# Patient Record
Sex: Female | Born: 1985
Health system: Southern US, Community
[De-identification: ages and names within clinical notes are randomized; demographics above are authoritative.]

## PROBLEM LIST (undated history)

## (undated) ENCOUNTER — Inpatient Hospital Stay (HOSPITAL_COMMUNITY): Payer: Self-pay

## (undated) DIAGNOSIS — R51 Headache: Secondary | ICD-10-CM

## (undated) DIAGNOSIS — R519 Headache, unspecified: Secondary | ICD-10-CM

## (undated) DIAGNOSIS — I10 Essential (primary) hypertension: Secondary | ICD-10-CM

## (undated) DIAGNOSIS — O24419 Gestational diabetes mellitus in pregnancy, unspecified control: Secondary | ICD-10-CM

## (undated) DIAGNOSIS — R87629 Unspecified abnormal cytological findings in specimens from vagina: Secondary | ICD-10-CM

## (undated) HISTORY — PX: WISDOM TOOTH EXTRACTION: SHX21

---
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
## 2007-02-07 ENCOUNTER — Other Ambulatory Visit: Admission: RE | Admit: 2007-02-07 | Discharge: 2007-02-07 | Payer: Self-pay | Admitting: Family Medicine

## 2007-04-11 ENCOUNTER — Ambulatory Visit: Payer: Self-pay | Admitting: Pediatrics

## 2007-07-26 ENCOUNTER — Ambulatory Visit: Payer: Self-pay | Admitting: *Deleted

## 2007-11-28 ENCOUNTER — Ambulatory Visit: Payer: Self-pay | Admitting: Pediatrics

## 2008-02-19 ENCOUNTER — Ambulatory Visit: Payer: Self-pay | Admitting: Pediatrics

## 2008-05-17 ENCOUNTER — Ambulatory Visit: Payer: Self-pay | Admitting: Pediatrics

## 2008-07-15 ENCOUNTER — Other Ambulatory Visit: Admission: RE | Admit: 2008-07-15 | Discharge: 2008-07-15 | Payer: Self-pay | Admitting: Family Medicine

## 2008-09-13 ENCOUNTER — Ambulatory Visit: Payer: Self-pay | Admitting: Pediatrics

## 2008-12-11 ENCOUNTER — Ambulatory Visit: Payer: Self-pay | Admitting: Pediatrics

## 2009-03-20 ENCOUNTER — Ambulatory Visit: Payer: Self-pay | Admitting: Pediatrics

## 2009-07-30 ENCOUNTER — Ambulatory Visit: Payer: Self-pay | Admitting: Pediatrics

## 2009-08-06 ENCOUNTER — Other Ambulatory Visit: Admission: RE | Admit: 2009-08-06 | Discharge: 2009-08-06 | Payer: Self-pay | Admitting: Family Medicine

## 2009-10-28 ENCOUNTER — Ambulatory Visit: Payer: Self-pay | Admitting: Pediatrics

## 2010-02-03 ENCOUNTER — Ambulatory Visit: Payer: Self-pay | Admitting: Pediatrics

## 2010-05-13 ENCOUNTER — Institutional Professional Consult (permissible substitution) (INDEPENDENT_AMBULATORY_CARE_PROVIDER_SITE_OTHER): Payer: 59 | Admitting: Family

## 2010-05-13 DIAGNOSIS — F909 Attention-deficit hyperactivity disorder, unspecified type: Secondary | ICD-10-CM

## 2010-08-17 ENCOUNTER — Institutional Professional Consult (permissible substitution) (INDEPENDENT_AMBULATORY_CARE_PROVIDER_SITE_OTHER): Payer: 59 | Admitting: Family

## 2010-08-17 DIAGNOSIS — R279 Unspecified lack of coordination: Secondary | ICD-10-CM

## 2010-08-17 DIAGNOSIS — F909 Attention-deficit hyperactivity disorder, unspecified type: Secondary | ICD-10-CM

## 2010-09-14 ENCOUNTER — Other Ambulatory Visit (HOSPITAL_COMMUNITY)
Admission: RE | Admit: 2010-09-14 | Discharge: 2010-09-14 | Disposition: A | Discharge status: Home / Self Care | Payer: 59 | Source: Ambulatory Visit | Attending: Family Medicine | Admitting: Family Medicine

## 2010-09-14 ENCOUNTER — Other Ambulatory Visit: Payer: Self-pay | Admitting: Family Medicine

## 2010-09-14 DIAGNOSIS — R87810 Cervical high risk human papillomavirus (HPV) DNA test positive: Secondary | ICD-10-CM | POA: Insufficient documentation

## 2010-09-14 DIAGNOSIS — Z124 Encounter for screening for malignant neoplasm of cervix: Secondary | ICD-10-CM | POA: Insufficient documentation

## 2010-11-05 ENCOUNTER — Other Ambulatory Visit: Payer: Self-pay | Admitting: Obstetrics and Gynecology

## 2010-11-16 ENCOUNTER — Institutional Professional Consult (permissible substitution) (INDEPENDENT_AMBULATORY_CARE_PROVIDER_SITE_OTHER): Payer: 59 | Admitting: Family

## 2010-11-16 DIAGNOSIS — F909 Attention-deficit hyperactivity disorder, unspecified type: Secondary | ICD-10-CM

## 2011-02-01 ENCOUNTER — Institutional Professional Consult (permissible substitution) (INDEPENDENT_AMBULATORY_CARE_PROVIDER_SITE_OTHER): Payer: 59 | Admitting: Pediatrics

## 2011-02-01 DIAGNOSIS — R279 Unspecified lack of coordination: Secondary | ICD-10-CM

## 2011-02-01 DIAGNOSIS — F909 Attention-deficit hyperactivity disorder, unspecified type: Secondary | ICD-10-CM

## 2011-04-29 ENCOUNTER — Institutional Professional Consult (permissible substitution): Payer: 59 | Admitting: Family

## 2011-05-03 ENCOUNTER — Institutional Professional Consult (permissible substitution) (INDEPENDENT_AMBULATORY_CARE_PROVIDER_SITE_OTHER): Payer: 59 | Admitting: Family

## 2011-05-03 DIAGNOSIS — R279 Unspecified lack of coordination: Secondary | ICD-10-CM

## 2011-05-03 DIAGNOSIS — F909 Attention-deficit hyperactivity disorder, unspecified type: Secondary | ICD-10-CM

## 2011-05-05 ENCOUNTER — Other Ambulatory Visit: Payer: Self-pay | Admitting: Obstetrics and Gynecology

## 2011-05-05 ENCOUNTER — Other Ambulatory Visit (HOSPITAL_COMMUNITY)
Admission: RE | Admit: 2011-05-05 | Discharge: 2011-05-05 | Disposition: A | Discharge status: Home / Self Care | Payer: 59 | Source: Ambulatory Visit | Attending: Obstetrics and Gynecology | Admitting: Obstetrics and Gynecology

## 2011-05-05 DIAGNOSIS — R87810 Cervical high risk human papillomavirus (HPV) DNA test positive: Secondary | ICD-10-CM | POA: Insufficient documentation

## 2011-05-05 DIAGNOSIS — Z01419 Encounter for gynecological examination (general) (routine) without abnormal findings: Secondary | ICD-10-CM | POA: Insufficient documentation

## 2011-07-29 ENCOUNTER — Institutional Professional Consult (permissible substitution) (INDEPENDENT_AMBULATORY_CARE_PROVIDER_SITE_OTHER): Payer: 59 | Admitting: Family

## 2011-07-29 DIAGNOSIS — F909 Attention-deficit hyperactivity disorder, unspecified type: Secondary | ICD-10-CM

## 2011-07-29 DIAGNOSIS — R279 Unspecified lack of coordination: Secondary | ICD-10-CM

## 2011-11-03 ENCOUNTER — Other Ambulatory Visit (HOSPITAL_COMMUNITY)
Admission: RE | Admit: 2011-11-03 | Discharge: 2011-11-03 | Disposition: A | Discharge status: Home / Self Care | Payer: 59 | Source: Ambulatory Visit | Attending: Obstetrics and Gynecology | Admitting: Obstetrics and Gynecology

## 2011-11-03 ENCOUNTER — Other Ambulatory Visit: Payer: Self-pay | Admitting: Obstetrics and Gynecology

## 2011-11-03 DIAGNOSIS — Z113 Encounter for screening for infections with a predominantly sexual mode of transmission: Secondary | ICD-10-CM | POA: Insufficient documentation

## 2011-11-03 DIAGNOSIS — Z01419 Encounter for gynecological examination (general) (routine) without abnormal findings: Secondary | ICD-10-CM | POA: Insufficient documentation

## 2011-11-03 DIAGNOSIS — Z1151 Encounter for screening for human papillomavirus (HPV): Secondary | ICD-10-CM | POA: Insufficient documentation

## 2011-11-03 DIAGNOSIS — R87810 Cervical high risk human papillomavirus (HPV) DNA test positive: Secondary | ICD-10-CM | POA: Insufficient documentation

## 2011-11-04 ENCOUNTER — Institutional Professional Consult (permissible substitution): Payer: 59 | Admitting: Family

## 2011-11-04 DIAGNOSIS — F909 Attention-deficit hyperactivity disorder, unspecified type: Secondary | ICD-10-CM

## 2012-02-01 ENCOUNTER — Institutional Professional Consult (permissible substitution): Payer: 59 | Admitting: Family

## 2012-02-01 DIAGNOSIS — F909 Attention-deficit hyperactivity disorder, unspecified type: Secondary | ICD-10-CM

## 2012-05-01 ENCOUNTER — Institutional Professional Consult (permissible substitution) (INDEPENDENT_AMBULATORY_CARE_PROVIDER_SITE_OTHER): Payer: 59 | Admitting: Family

## 2012-05-01 DIAGNOSIS — F909 Attention-deficit hyperactivity disorder, unspecified type: Secondary | ICD-10-CM

## 2012-08-01 ENCOUNTER — Institutional Professional Consult (permissible substitution) (INDEPENDENT_AMBULATORY_CARE_PROVIDER_SITE_OTHER): Payer: 59 | Admitting: Family

## 2012-08-01 DIAGNOSIS — F909 Attention-deficit hyperactivity disorder, unspecified type: Secondary | ICD-10-CM

## 2012-08-01 DIAGNOSIS — R279 Unspecified lack of coordination: Secondary | ICD-10-CM

## 2012-10-30 ENCOUNTER — Institutional Professional Consult (permissible substitution) (INDEPENDENT_AMBULATORY_CARE_PROVIDER_SITE_OTHER): Payer: 59 | Admitting: Family

## 2012-10-30 DIAGNOSIS — F909 Attention-deficit hyperactivity disorder, unspecified type: Secondary | ICD-10-CM

## 2012-10-30 DIAGNOSIS — R279 Unspecified lack of coordination: Secondary | ICD-10-CM

## 2012-11-06 ENCOUNTER — Other Ambulatory Visit: Payer: Self-pay | Admitting: Obstetrics and Gynecology

## 2012-11-06 ENCOUNTER — Other Ambulatory Visit (HOSPITAL_COMMUNITY)
Admission: RE | Admit: 2012-11-06 | Discharge: 2012-11-06 | Disposition: A | Discharge status: Home / Self Care | Payer: 59 | Source: Ambulatory Visit | Attending: Obstetrics and Gynecology | Admitting: Obstetrics and Gynecology

## 2012-11-06 DIAGNOSIS — Z01419 Encounter for gynecological examination (general) (routine) without abnormal findings: Secondary | ICD-10-CM | POA: Insufficient documentation

## 2013-01-26 ENCOUNTER — Institutional Professional Consult (permissible substitution) (INDEPENDENT_AMBULATORY_CARE_PROVIDER_SITE_OTHER): Payer: 59 | Admitting: Family

## 2013-01-26 DIAGNOSIS — F909 Attention-deficit hyperactivity disorder, unspecified type: Secondary | ICD-10-CM

## 2013-01-26 DIAGNOSIS — R279 Unspecified lack of coordination: Secondary | ICD-10-CM

## 2013-03-03 ENCOUNTER — Emergency Department (HOSPITAL_COMMUNITY)
Admission: EM | Admit: 2013-03-03 | Discharge: 2013-03-03 | Disposition: A | Discharge status: Home / Self Care | Payer: 59 | Source: Home / Self Care | Attending: Family Medicine | Admitting: Family Medicine

## 2013-03-03 ENCOUNTER — Encounter (HOSPITAL_COMMUNITY): Payer: Self-pay | Admitting: Emergency Medicine

## 2013-03-03 DIAGNOSIS — J00 Acute nasopharyngitis [common cold]: Secondary | ICD-10-CM

## 2013-03-03 MED ORDER — AZITHROMYCIN 250 MG PO TABS: ORAL_TABLET | ORAL | Status: DC

## 2013-03-03 MED ORDER — HYDROCOD POLST-CHLORPHEN POLST 10-8 MG/5ML PO LQCR: 5.0000 mL | Freq: Two times a day (BID) | ORAL | Status: DC | PRN

## 2013-03-03 MED ORDER — IPRATROPIUM BROMIDE 0.06 % NA SOLN: 2.0000 | Freq: Four times a day (QID) | NASAL | Status: DC

## 2013-03-03 NOTE — ED Notes (Signed)
C/o cough , sore throat on Wednesday, chest congestion some drainage

## 2013-03-03 NOTE — Discharge Instructions (Signed)
Take the antibiotic only if you are not getting better by the end of the weekend, or if you are starting to feel significantly sicker in the next few days.    Upper Respiratory Infection, Adult An upper respiratory infection (URI) is also sometimes known as the common cold. The upper respiratory tract includes the nose, sinuses, throat, trachea, and bronchi. Bronchi are the airways leading to the lungs. Most people improve within 1 week, but symptoms can last up to 2 weeks. A residual cough may last even longer.  CAUSES Many different viruses can infect the tissues lining the upper respiratory tract. The tissues become irritated and inflamed and often become very moist. Mucus production is also common. A cold is contagious. You can easily spread the virus to others by oral contact. This includes kissing, sharing a glass, coughing, or sneezing. Touching your mouth or nose and then touching a surface, which is then touched by another person, can also spread the virus. SYMPTOMS  Symptoms typically develop 1 to 3 days after you come in contact with a cold virus. Symptoms vary from person to person. They may include:  Runny nose.  Sneezing.  Nasal congestion.  Sinus irritation.  Sore throat.  Loss of voice (laryngitis).  Cough.  Fatigue.  Muscle aches.  Loss of appetite.  Headache.  Low-grade fever. DIAGNOSIS  You might diagnose your own cold based on familiar symptoms, since most people get a cold 2 to 3 times a year. Your caregiver can confirm this based on your exam. Most importantly, your caregiver can check that your symptoms are not due to another disease such as strep throat, sinusitis, pneumonia, asthma, or epiglottitis. Blood tests, throat tests, and X-rays are not necessary to diagnose a common cold, but they may sometimes be helpful in excluding other more serious diseases. Your caregiver will decide if any further tests are required. RISKS AND COMPLICATIONS  You may be at  risk for a more severe case of the common cold if you smoke cigarettes, have chronic heart disease (such as heart failure) or lung disease (such as asthma), or if you have a weakened immune system. The very young and very old are also at risk for more serious infections. Bacterial sinusitis, middle ear infections, and bacterial pneumonia can complicate the common cold. The common cold can worsen asthma and chronic obstructive pulmonary disease (COPD). Sometimes, these complications can require emergency medical care and may be life-threatening. PREVENTION  The best way to protect against getting a cold is to practice good hygiene. Avoid oral or hand contact with people with cold symptoms. Wash your hands often if contact occurs. There is no clear evidence that vitamin C, vitamin E, echinacea, or exercise reduces the chance of developing a cold. However, it is always recommended to get plenty of rest and practice good nutrition. TREATMENT  Treatment is directed at relieving symptoms. There is no cure. Antibiotics are not effective, because the infection is caused by a virus, not by bacteria. Treatment may include:  Increased fluid intake. Sports drinks offer valuable electrolytes, sugars, and fluids.  Breathing heated mist or steam (vaporizer or shower).  Eating chicken soup or other clear broths, and maintaining good nutrition.  Getting plenty of rest.  Using gargles or lozenges for comfort.  Controlling fevers with ibuprofen or acetaminophen as directed by your caregiver.  Increasing usage of your inhaler if you have asthma. Zinc gel and zinc lozenges, taken in the first 24 hours of the common cold, can shorten the  duration and lessen the severity of symptoms. Pain medicines may help with fever, muscle aches, and throat pain. A variety of non-prescription medicines are available to treat congestion and runny nose. Your caregiver can make recommendations and may suggest nasal or lung inhalers for  other symptoms.  HOME CARE INSTRUCTIONS   Only take over-the-counter or prescription medicines for pain, discomfort, or fever as directed by your caregiver.  Use a warm mist humidifier or inhale steam from a shower to increase air moisture. This may keep secretions moist and make it easier to breathe.  Drink enough water and fluids to keep your urine clear or pale yellow.  Rest as needed.  Return to work when your temperature has returned to normal or as your caregiver advises. You may need to stay home longer to avoid infecting others. You can also use a face mask and careful hand washing to prevent spread of the virus. SEEK MEDICAL CARE IF:   After the first few days, you feel you are getting worse rather than better.  You need your caregiver's advice about medicines to control symptoms.  You develop chills, worsening shortness of breath, or brown or red sputum. These may be signs of pneumonia.  You develop yellow or brown nasal discharge or pain in the face, especially when you bend forward. These may be signs of sinusitis.  You develop a fever, swollen neck glands, pain with swallowing, or white areas in the back of your throat. These may be signs of strep throat. SEEK IMMEDIATE MEDICAL CARE IF:   You have a fever.  You develop severe or persistent headache, ear pain, sinus pain, or chest pain.  You develop wheezing, a prolonged cough, cough up blood, or have a change in your usual mucus (if you have chronic lung disease).  You develop sore muscles or a stiff neck. Document Released: 08/04/2000 Document Revised: 05/03/2011 Document Reviewed: 06/12/2010 [facility] Patient Information 2014 [place], [place].

## 2013-03-03 NOTE — ED Provider Notes (Signed)
CSN: [id number]     Arrival date & time 03/03/13  4098 History   First MD Initiated Contact with Patient 03/03/13 0932     Chief Complaint  Patient presents with  . URI   (Consider location/radiation/quality/duration/timing/severity/associated sxs/prior Treatment) HPI Comments: 28 year old female who takes methotrexate for psoriasis presents complaining of a cold for 3 days. She has Sore throat, cough, subjective fevers that started on Wednesday 3 days ago, not getting better.  She did take dose of methotrexate after this started although she states that she knows to not take it if she is sick.  She says that overall her symptoms are moderate. She is taking over-the-counter medications with some relief of her symptoms  Patient is a 28 y.o. female presenting with URI.  URI Presenting symptoms: congestion, cough and ear pain (mild, resolving)   Presenting symptoms: no fever   Associated symptoms: no arthralgias and no myalgias     History reviewed. No pertinent past medical history. History reviewed. No pertinent past surgical history. No family history on file. History  Substance Use Topics  . Smoking status: Never Smoker   . Smokeless tobacco: Never Used  . Alcohol Use: No   OB History   Grav Para Term Preterm Abortions TAB SAB Ect Mult Living                 Review of Systems  Constitutional: Positive for chills. Negative for fever.  HENT: Positive for congestion, ear pain (mild, resolving) and sinus pressure (resolved).   Eyes: Negative for visual disturbance.  Respiratory: Positive for cough. Negative for chest tightness and shortness of breath.   Cardiovascular: Negative for chest pain, palpitations and leg swelling.  Gastrointestinal: Negative for nausea, vomiting, abdominal pain and diarrhea.  Endocrine: Negative for polydipsia and polyuria.  Genitourinary: Negative for dysuria, urgency and frequency.  Musculoskeletal: Negative for arthralgias and myalgias.  Skin:  Negative for rash.  Neurological: Negative for dizziness, weakness and light-headedness.    Allergies  Amoxicillin and Penicillins  Home Medications   Current Outpatient Rx  Name  Route  Sig  Dispense  Refill  . FOLIC ACID PO   Oral   Take by mouth daily.         . Lisdexamfetamine Dimesylate (VYVANSE PO)   Oral   Take 100 mg by mouth daily.         . methotrexate (RHEUMATREX) 2.5 MG tablet   Oral   Take 2.5 mg by mouth as directed. Caution:Chemotherapy. Protect from light.         . norethindrone-ethinyl estradiol-iron (ESTROSTEP FE,TILIA FE,TRI-LEGEST FE) 1-20/1-30/1-35 MG-MCG tablet   Oral   Take 1 tablet by mouth daily.         Marland Kitchen azithromycin (ZITHROMAX Z-PAK) 250 MG tablet      Use as directed   6 each   0   . chlorpheniramine-HYDROcodone (TUSSIONEX PENNKINETIC ER) 10-8 MG/5ML LQCR   Oral   Take 5 mLs by mouth every 12 (twelve) hours as needed for cough.   115 mL   0   . ipratropium (ATROVENT) 0.06 % nasal spray   Each Nare   Place 2 sprays into both nostrils 4 (four) times daily.   15 mL   12    BP 104/84  Pulse 115  Temp(Src) 98.5 F (36.9 C) (Oral)  Resp 18  SpO2 100%  LMP 03/03/2013 Physical Exam  Nursing note and vitals reviewed. Constitutional: She is oriented to person, place, and time. Vital signs are  normal. She appears well-developed and well-nourished. No distress.  HENT:  Head: Normocephalic and atraumatic.  Right Ear: External ear normal.  Left Ear: External ear normal.  Nose: Nose normal.  Mouth/Throat: Posterior oropharyngeal erythema (mild) present. No oropharyngeal exudate.  Neck: Normal range of motion. Neck supple.  Cardiovascular: Regular rhythm and normal heart sounds.  Tachycardia present.  Exam reveals no gallop and no friction rub.   No murmur heard. Pulmonary/Chest: Effort normal and breath sounds normal. No respiratory distress. She has no wheezes. She has no rales.  Lymphadenopathy:    She has no cervical  adenopathy.  Neurological: She is alert and oriented to person, place, and time. She has normal strength. Coordination normal.  Skin: Skin is warm and dry. No rash noted. She is not diaphoretic.  Psychiatric: She has a normal mood and affect. Judgment normal.    ED Course  Procedures (including critical care time) Labs Review Labs Reviewed - No data to display Imaging Review No results found.    MDM   1. Acute nasopharyngitis (common cold)     Tachycardia most likely sencondary to vyvanse.  Common cold, post-dated Rx for z pack to start if any worsening bc she is on methotrexate.  Hold methotrexate until symptoms and illness resolves.  symptomatic management for now.  F/u PRN     Meds ordered this encounter  Medications  . azithromycin (ZITHROMAX Z-PAK) 250 MG tablet    Sig: Use as directed    Dispense:  6 each    Refill:  0    Order Specific Question:  Supervising Provider    Answer:  Linna HoffKINDL, JAMES D [phone][[id number]]  . ipratropium (ATROVENT) 0.06 % nasal spray    Sig: Place 2 sprays into both nostrils 4 (four) times daily.    Dispense:  15 mL    Refill:  12    Order Specific Question:  Supervising Provider    Answer:  Linna HoffKINDL, JAMES D [phone][[id number]]  . chlorpheniramine-HYDROcodone (TUSSIONEX PENNKINETIC ER) 10-8 MG/5ML LQCR    Sig: Take 5 mLs by mouth every 12 (twelve) hours as needed for cough.    Dispense:  115 mL    Refill:  0    Order Specific Question:  Supervising Provider    Answer:  Bradd CanaryKINDL, JAMES D [[id number]]     Graylon GoodZachary H Chiyo Fay, PA-C 03/04/13 [phone]

## 2013-03-05 NOTE — ED Provider Notes (Signed)
Medical screening examination/treatment/procedure(s) were performed by a resident physician or non-physician practitioner and as the supervising physician I was immediately available for consultation/collaboration.  Cloey Sferrazza, MD    Anwitha Mapes S Hodan Wurtz, MD 03/05/13 0747 

## 2013-04-23 ENCOUNTER — Institutional Professional Consult (permissible substitution) (INDEPENDENT_AMBULATORY_CARE_PROVIDER_SITE_OTHER): Payer: 59 | Admitting: Family

## 2013-04-23 DIAGNOSIS — F909 Attention-deficit hyperactivity disorder, unspecified type: Secondary | ICD-10-CM

## 2013-04-23 DIAGNOSIS — R279 Unspecified lack of coordination: Secondary | ICD-10-CM

## 2014-10-15 ENCOUNTER — Emergency Department (HOSPITAL_COMMUNITY)
Admission: EM | Admit: 2014-10-15 | Discharge: 2014-10-15 | Disposition: A | Discharge status: Home / Self Care | Payer: 59 | Attending: Emergency Medicine | Admitting: Emergency Medicine

## 2014-10-15 ENCOUNTER — Encounter (HOSPITAL_COMMUNITY): Payer: Self-pay | Admitting: Emergency Medicine

## 2014-10-15 DIAGNOSIS — Z79899 Other long term (current) drug therapy: Secondary | ICD-10-CM | POA: Insufficient documentation

## 2014-10-15 DIAGNOSIS — J020 Streptococcal pharyngitis: Secondary | ICD-10-CM | POA: Insufficient documentation

## 2014-10-15 DIAGNOSIS — Z880 Allergy status to penicillin: Secondary | ICD-10-CM | POA: Insufficient documentation

## 2014-10-15 LAB — RAPID STREP SCREEN (MED CTR MEBANE ONLY): Streptococcus, Group A Screen (Direct): POSITIVE — AB

## 2014-10-15 MED ORDER — HYDROCODONE-ACETAMINOPHEN 5-325 MG PO TABS
1.0000 | ORAL_TABLET | Freq: Once | ORAL | Status: AC
  Administered 2014-10-15: 1 via ORAL
  Filled 2014-10-15: qty 1

## 2014-10-15 MED ORDER — IBUPROFEN 400 MG PO TABS
800.0000 mg | ORAL_TABLET | Freq: Once | ORAL | Status: AC
  Administered 2014-10-15: 800 mg via ORAL
  Filled 2014-10-15: qty 2

## 2014-10-15 MED ORDER — HYDROCODONE-ACETAMINOPHEN 5-325 MG PO TABS: 1.0000 | ORAL_TABLET | Freq: Three times a day (TID) | ORAL | Status: DC | PRN

## 2014-10-15 MED ORDER — AZITHROMYCIN 250 MG PO TABS: 250.0000 mg | ORAL_TABLET | Freq: Every day | ORAL | Status: DC

## 2014-10-15 MED ORDER — DEXAMETHASONE SODIUM PHOSPHATE 10 MG/ML IJ SOLN
10.0000 mg | Freq: Once | INTRAMUSCULAR | Status: AC
  Administered 2014-10-15: 10 mg via INTRAMUSCULAR
  Filled 2014-10-15: qty 1

## 2014-10-15 NOTE — ED Provider Notes (Signed)
CSN: [id number]     Arrival date & time 10/15/14  1122 History  This chart was scribed for non-physician practitioner, Dierdre Forth, PA-C working with Melene Plan, DO, by Jarvis Vesta, ED Scribe. This patient was seen in room TR11C/TR11C and the patient's care was started at 12:13 PM.     Chief Complaint  Patient presents with  . Sore Throat   The history is provided by the patient and medical records. No language interpreter was used.    HPI Comments: Margaret Little is a 29 y.o. female with no PMHx who presents to the Emergency Department complaining of constant, mild sore throat onset 5 days. She reports associated nausea, bilateral ear pain, low grade fever (100.5F), and post nasal drip. 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 She has taken DayQuil and Nyquil with no relief. She denies any cough, vomiting, diarrhea or other complaints at this time.  Pt has never had a tonsillectomy.   History reviewed. No pertinent past medical history. History reviewed. No pertinent past surgical history. No family history on file. Social History  Substance Use Topics  . Smoking status: Never Smoker   . Smokeless tobacco: Never Used  . Alcohol Use: No   OB History    No data available     Review of Systems  Constitutional: Positive for fever (low grade). Negative for chills, diaphoresis, appetite change, fatigue and unexpected weight change.  HENT: Positive for ear pain (bilateral), postnasal drip and sore throat. Negative for ear discharge and mouth sores.   Eyes: Negative for visual disturbance.  Respiratory: Negative for cough, chest tightness, shortness of breath and wheezing.   Cardiovascular: Negative for chest pain.  Gastrointestinal: Positive for nausea. Negative for vomiting, abdominal pain, diarrhea and constipation.  Endocrine: Negative for polydipsia, polyphagia and polyuria.  Genitourinary: Negative for dysuria, urgency, frequency and hematuria.   Musculoskeletal: Negative for back pain and neck stiffness.  Skin: Negative for rash.  Allergic/Immunologic: Negative for immunocompromised state.  Neurological: Negative for syncope, light-headedness and headaches.  Hematological: Does not bruise/bleed easily.  Psychiatric/Behavioral: Negative for sleep disturbance. The patient is not nervous/anxious.       Allergies  Amoxicillin and Penicillins  Home Medications   Prior to Admission medications   Medication Sig Start Date End Date Taking? Authorizing Provider  azithromycin (ZITHROMAX) 250 MG tablet Take 1 tablet (250 mg total) by mouth daily. Take first 2 tablets together, then 1 every day until finished. 10/15/14   Cliffie Gingras, PA-C  chlorpheniramine-HYDROcodone (TUSSIONEX PENNKINETIC ER) 10-8 MG/5ML LQCR Take 5 mLs by mouth every 12 (twelve) hours as needed for cough. 03/03/13   Adrian Blackwater Baker, PA-C  FOLIC ACID PO Take by mouth daily.    Historical Provider, MD  HYDROcodone-acetaminophen (NORCO/VICODIN) 5-325 MG per tablet Take 1 tablet by mouth every 8 (eight) hours as needed for moderate pain or severe pain. 10/15/14   Yulitza Shorts, PA-C  ipratropium (ATROVENT) 0.06 % nasal spray Place 2 sprays into both nostrils 4 (four) times daily. 03/03/13   Graylon Good, PA-C  Lisdexamfetamine Dimesylate (VYVANSE PO) Take 100 mg by mouth daily.    Historical Provider, MD  methotrexate (RHEUMATREX) 2.5 MG tablet Take 2.5 mg by mouth as directed. Caution:Chemotherapy. Protect from light.    Historical Provider, MD  norethindrone-ethinyl estradiol-iron (ESTROSTEP FE,TILIA FE,TRI-LEGEST FE) 1-20/1-30/1-35 MG-MCG tablet Take 1 tablet by mouth daily.    Historical Provider, MD   Triage Vitals: BP 144/99 mmHg  Pulse  106  Temp(Src) 98.1 F (36.7 C) (Oral)  Resp 20  Ht 5\' 7"  (1.702 m)  Wt 200 lb (90.719 kg)  BMI 31.32 kg/m2  SpO2 97%  LMP 10/01/2014 (Approximate)  Physical Exam  Constitutional: She appears well-developed and  well-nourished. No distress.  HENT:  Head: Normocephalic and atraumatic.  Right Ear: Tympanic membrane, external ear and ear canal normal.  Left Ear: Tympanic membrane, external ear and ear canal normal.  Nose: Nose normal. No mucosal edema or rhinorrhea.  Mouth/Throat: Uvula is midline and mucous membranes are normal. Mucous membranes are not dry. No trismus in the jaw. No uvula swelling. Oropharyngeal exudate, posterior oropharyngeal edema and posterior oropharyngeal erythema present. No tonsillar abscesses.  Posterior oropharynx with erythema and edema without exudate on the tonsils  Eyes: Conjunctivae are normal.  Neck: Normal range of motion, full passive range of motion without pain and phonation normal. No tracheal tenderness, no spinous process tenderness and no muscular tenderness present. No rigidity. No erythema and normal range of motion present. No Brudzinski's sign and no Kernig's sign noted.  Range of motion without pain  No midline or paraspinal tenderness Normal phonation No stridor Handling secretions without difficulty No nuchal rigidity or meningeal signs  Cardiovascular: Normal rate, regular rhythm, normal heart sounds and intact distal pulses.   Pulses:      Radial pulses are 2+ on the right side, and 2+ on the left side.  Pulmonary/Chest: Effort normal and breath sounds normal. No stridor. No respiratory distress. She has no decreased breath sounds. She has no wheezes.  Equal chest expansion, clear and equal breath sounds without focal wheezes, rhonchi or rales  Musculoskeletal: Normal range of motion.  Lymphadenopathy:       Head (right side): Submandibular and tonsillar adenopathy present. No submental, no preauricular, no posterior auricular and no occipital adenopathy present.       Head (left side): Submandibular and tonsillar adenopathy present. No submental, no preauricular, no posterior auricular and no occipital adenopathy present.    She has cervical  adenopathy (anterior, tender to palpation).       Right cervical: No superficial cervical, no deep cervical and no posterior cervical adenopathy present.      Left cervical: No superficial cervical, no deep cervical and no posterior cervical adenopathy present.  Neurological: She is alert.  Alert and oriented Moves all extremities without ataxia  Skin: Skin is warm and dry. She is not diaphoretic.  Psychiatric: She has a normal mood and affect.  Nursing note and vitals reviewed.   ED Course  Procedures (including critical care time)  DIAGNOSTIC STUDIES: Oxygen Saturation is 97% on RA, normal by my interpretation.    COORDINATION OF CARE:    Labs Review Labs Reviewed  RAPID STREP SCREEN (NOT AT [facility]) - Abnormal; Notable for the following:    Streptococcus, Group A Screen (Direct) POSITIVE (*)    All other components within normal limits    Imaging Review No results found. I have personally reviewed and evaluated these images and lab results as part of my medical decision-making.   EKG Interpretation None      MDM   Final diagnoses:  Strep pharyngitis    NARY SNEED presents with sore throat.  Pt febrile (at home) with tonsillar edema & erythema, cervical lymphadenopathy, & dysphagia; diagnosis of strep. Treated in the ED with steroids, NSAIDs, Pain medication.  Pt appears mildly dehydrated, discussed importance of water rehydration. Presentation non concerning for PTA or infxn spread to  soft tissue. No trismus or uvula deviation. Specific return precautions discussed. Pt able to drink water in ED without difficulty with intact air way. Recommended PCP follow up.   I personally performed the services described in this documentation, which was scribed in my presence. The recorded information has been reviewed and is accurate.    Dierdre Forth, PA-C 10/15/14 1250  Melene Plan, DO 10/15/14 1504

## 2014-10-15 NOTE — ED Notes (Signed)
C/o sore throat and nausea x 5 days.

## 2014-10-15 NOTE — Discharge Instructions (Signed)
1. Medications: azithromycin, vicodin only for severe pain, ibuprofen for mild-moderate pain, usual home medications 2. Treatment: rest, drink plenty of fluids, 3. Follow Up: Please followup with your primary doctor in 3 days for discussion of your diagnoses and further evaluation after today's visit; if you do not have a primary care doctor use the resource guide provided to find one; Please return to the ER for worsening symptoms, difficulty swallowing or breathing   Sore Throat A sore throat is pain, burning, irritation, or scratchiness of the throat. There is often pain or tenderness when swallowing or talking. A sore throat may be accompanied by other symptoms, such as coughing, sneezing, fever, and swollen neck glands. A sore throat is often the first sign of another sickness, such as a cold, flu, strep throat, or mononucleosis (commonly known as mono). Most sore throats go away without medical treatment. CAUSES  The most common causes of a sore throat include:  A viral infection, such as a cold, flu, or mono.  A bacterial infection, such as strep throat, tonsillitis, or whooping cough.  Seasonal allergies.  Dryness in the air.  Irritants, such as smoke or pollution.  Gastroesophageal reflux disease (GERD). HOME CARE INSTRUCTIONS   Only take over-the-counter medicines as directed by your caregiver.  Drink enough fluids to keep your urine clear or pale yellow.  Rest as needed.  Try using throat sprays, lozenges, or sucking on hard candy to ease any pain (if older than 4 years or as directed).  Sip warm liquids, such as broth, herbal tea, or warm water with honey to relieve pain temporarily. You may also eat or drink cold or frozen liquids such as frozen ice pops.  Gargle with salt water (mix 1 tsp salt with 8 oz of water).  Do not smoke and avoid secondhand smoke.  Put a cool-mist humidifier in your bedroom at night to moisten the air. You can also turn on a hot shower and  sit in the bathroom with the door closed for 5-10 minutes. SEEK IMMEDIATE MEDICAL CARE IF:  You have difficulty breathing.  You are unable to swallow fluids, soft foods, or your saliva.  You have increased swelling in the throat.  Your sore throat does not get better in 7 days.  You have nausea and vomiting.  You have a fever or persistent symptoms for more than 2-3 days.  You have a fever and your symptoms suddenly get worse. MAKE SURE YOU:   Understand these instructions.  Will watch your condition.  Will get help right away if you are not doing well or get worse. Document Released: 03/18/2004 Document Revised: 01/26/2012 Document Reviewed: 10/17/2011 [facility] Patient Information 2015 [place], [place]. This information is not intended to replace advice given to you by your health care provider. Make sure you discuss any questions you have with your health care provider.

## 2015-02-17 ENCOUNTER — Encounter (HOSPITAL_COMMUNITY): Payer: Self-pay | Admitting: Emergency Medicine

## 2015-02-17 DIAGNOSIS — R197 Diarrhea, unspecified: Secondary | ICD-10-CM | POA: Insufficient documentation

## 2015-02-17 DIAGNOSIS — Z79899 Other long term (current) drug therapy: Secondary | ICD-10-CM | POA: Insufficient documentation

## 2015-02-17 DIAGNOSIS — Z3202 Encounter for pregnancy test, result negative: Secondary | ICD-10-CM | POA: Insufficient documentation

## 2015-02-17 DIAGNOSIS — Z880 Allergy status to penicillin: Secondary | ICD-10-CM | POA: Insufficient documentation

## 2015-02-17 DIAGNOSIS — R112 Nausea with vomiting, unspecified: Secondary | ICD-10-CM | POA: Insufficient documentation

## 2015-02-17 DIAGNOSIS — Z793 Long term (current) use of hormonal contraceptives: Secondary | ICD-10-CM | POA: Insufficient documentation

## 2015-02-17 LAB — CBC
HCT: 38.1 % (ref 36.0–46.0)
Hemoglobin: 12.4 g/dL (ref 12.0–15.0)
MCH: 28.2 pg (ref 26.0–34.0)
MCHC: 32.5 g/dL (ref 30.0–36.0)
MCV: 86.6 fL (ref 78.0–100.0)
Platelets: 248 10*3/uL (ref 150–400)
RBC: 4.4 MIL/uL (ref 3.87–5.11)
RDW: 13.3 % (ref 11.5–15.5)
WBC: 9.6 10*3/uL (ref 4.0–10.5)

## 2015-02-17 LAB — COMPREHENSIVE METABOLIC PANEL
ALT: 25 U/L (ref 14–54)
AST: 25 U/L (ref 15–41)
Albumin: 3.6 g/dL (ref 3.5–5.0)
Alkaline Phosphatase: 66 U/L (ref 38–126)
Anion gap: 9 (ref 5–15)
BUN: 9 mg/dL (ref 6–20)
CO2: 28 mmol/L (ref 22–32)
Calcium: 9 mg/dL (ref 8.9–10.3)
Chloride: 105 mmol/L (ref 101–111)
Creatinine, Ser: 0.92 mg/dL (ref 0.44–1.00)
GFR calc Af Amer: >60 mL/min (ref >60)
GFR calc non Af Amer: >60 mL/min (ref >60)
Glucose, Bld: 107 mg/dL — ABNORMAL HIGH (ref 65–99)
Potassium: 3.8 mmol/L (ref 3.5–5.1)
Sodium: 142 mmol/L (ref 135–145)
Total Bilirubin: 0.2 mg/dL — ABNORMAL LOW (ref 0.3–1.2)
Total Protein: 6.7 g/dL (ref 6.5–8.1)

## 2015-02-17 LAB — URINALYSIS, ROUTINE W REFLEX MICROSCOPIC
Glucose, UA: NEGATIVE mg/dL
Hgb urine dipstick: NEGATIVE
Ketones, ur: 15 mg/dL — AB
Leukocytes, UA: NEGATIVE
Nitrite: NEGATIVE
Protein, ur: NEGATIVE mg/dL
Specific Gravity, Urine: 1.034 — ABNORMAL HIGH (ref 1.005–1.030)
pH: 6.5 (ref 5.0–8.0)

## 2015-02-17 LAB — HCG, QUANTITATIVE, PREGNANCY: hCG, Beta Chain, Quant, S: <1 m[IU]/mL (ref <5)

## 2015-02-17 LAB — LIPASE, BLOOD: Lipase: 25 U/L (ref 11–51)

## 2015-02-17 NOTE — ED Notes (Signed)
Pt reports nvd x 1 week worse over past few days.

## 2015-02-18 ENCOUNTER — Emergency Department (HOSPITAL_COMMUNITY)
Admission: EM | Admit: 2015-02-18 | Discharge: 2015-02-18 | Disposition: A | Discharge status: Home / Self Care | Payer: 59 | Attending: Emergency Medicine | Admitting: Emergency Medicine

## 2015-02-18 DIAGNOSIS — R197 Diarrhea, unspecified: Secondary | ICD-10-CM

## 2015-02-18 DIAGNOSIS — R112 Nausea with vomiting, unspecified: Secondary | ICD-10-CM

## 2015-02-18 MED ORDER — LOPERAMIDE HCL 2 MG PO CAPS: 2.0000 mg | ORAL_CAPSULE | Freq: Four times a day (QID) | ORAL | Status: DC | PRN

## 2015-02-18 MED ORDER — ONDANSETRON 4 MG PO TBDP: 4.0000 mg | ORAL_TABLET | Freq: Three times a day (TID) | ORAL | Status: DC | PRN

## 2015-02-18 NOTE — Discharge Instructions (Signed)
Take the prescribed medication as directed to help with nausea/vomiting/diarrhea. May wish to start with bland diet and progress back to normal as tolerated. Follow-up with your primary care physician. Return to the ED for new or worsening symptoms.

## 2015-02-18 NOTE — ED Provider Notes (Signed)
CSN: [id number]     Arrival date & time 02/17/15  2038 History   First MD Initiated Contact with Patient 02/18/15 0034     Chief Complaint  Patient presents with  . Emesis  . Diarrhea     (Consider location/radiation/quality/duration/timing/severity/associated sxs/prior Treatment) The history is provided by the patient and medical records.    29 y.o. F here with N/V/D intermittently for the past week.  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  States initially symptoms were getting better however have since returned and seem worse than before.  Denies abdominal pain.  Denies fever, chills, sweats.  States she does have a sore throat but thinks that is due to vomiting.  No hematemesis.  No difficulty swallowing.  No chest pain or SOB.  Patient has not tried any intervention PTA.    History reviewed. No pertinent past medical history. History reviewed. No pertinent past surgical history. No family history on file. Social History  Substance Use Topics  . Smoking status: Never Smoker   . Smokeless tobacco: Never Used  . Alcohol Use: No   OB History    No data available     Review of Systems  Gastrointestinal: Positive for nausea, vomiting and diarrhea.  All other systems reviewed and are negative.     Allergies  Amoxicillin and Penicillins  Home Medications   Prior to Admission medications   Medication Sig Start Date End Date Taking? Authorizing Provider  azithromycin (ZITHROMAX) 250 MG tablet Take 1 tablet (250 mg total) by mouth daily. Take first 2 tablets together, then 1 every day until finished. 10/15/14   Hannah Muthersbaugh, PA-C  chlorpheniramine-HYDROcodone (TUSSIONEX PENNKINETIC ER) 10-8 MG/5ML LQCR Take 5 mLs by mouth every 12 (twelve) hours as needed for cough. 03/03/13   Adrian Blackwater Baker, PA-C  FOLIC ACID PO Take by mouth daily.    Historical Provider, MD  HYDROcodone-acetaminophen (NORCO/VICODIN) 5-325 MG per tablet Take 1 tablet by mouth  every 8 (eight) hours as needed for moderate pain or severe pain. 10/15/14   Hannah Muthersbaugh, PA-C  ipratropium (ATROVENT) 0.06 % nasal spray Place 2 sprays into both nostrils 4 (four) times daily. 03/03/13   Graylon Good, PA-C  Lisdexamfetamine Dimesylate (VYVANSE PO) Take 100 mg by mouth daily.    Historical Provider, MD  methotrexate (RHEUMATREX) 2.5 MG tablet Take 2.5 mg by mouth as directed. Caution:Chemotherapy. Protect from light.    Historical Provider, MD  norethindrone-ethinyl estradiol-iron (ESTROSTEP FE,TILIA FE,TRI-LEGEST FE) 1-20/1-30/1-35 MG-MCG tablet Take 1 tablet by mouth daily.    Historical Provider, MD   BP 141/95 mmHg  Pulse 92  Temp(Src) 98.5 F (36.9 C) (Oral)  Resp 22  Ht  (1.702 m)  Wt 122.726 kg  BMI 42.37 kg/m2  SpO2 98%  LMP 02/03/2015   Physical Exam  Constitutional: She is oriented to person, place, and time. She appears well-developed and well-nourished. No distress.  HENT:  Head: Normocephalic and atraumatic.  Right Ear: Tympanic membrane and ear canal normal.  Left Ear: Tympanic membrane and ear canal normal.  Nose: Nose normal.  Mouth/Throat: Uvula is midline, oropharynx is clear and moist and mucous membranes are normal. No oropharyngeal exudate, posterior oropharyngeal edema, posterior oropharyngeal erythema or tonsillar abscesses.  Oropharynx appears irritated; tonsils normal in appearance bilaterally without exudate; uvula midline without peritonsillar abscess; handling secretions appropriately; no difficulty swallowing or speaking  Eyes: Conjunctivae and EOM are normal. Pupils are equal, round, and reactive to light.  Neck:  Normal range of motion. Neck supple.  Cardiovascular: Normal rate, regular rhythm and normal heart sounds.   Pulmonary/Chest: Effort normal and breath sounds normal. No respiratory distress. She has no wheezes.  Abdominal: Soft. Bowel sounds are normal. There is no tenderness. There is no guarding.  Musculoskeletal:  Normal range of motion.  Neurological: She is alert and oriented to person, place, and time.  Skin: Skin is warm and dry. She is not diaphoretic.  Psychiatric: She has a normal mood and affect.  Nursing note and vitals reviewed.   ED Course  Procedures (including critical care time) Labs Review Labs Reviewed  COMPREHENSIVE METABOLIC PANEL - Abnormal; Notable for the following:    Glucose, Bld 107 (*)    Total Bilirubin 0.2 (*)    All other components within normal limits  URINALYSIS, ROUTINE W REFLEX MICROSCOPIC (NOT AT [facility]) - Abnormal; Notable for the following:    Color, Urine AMBER (*)    APPearance CLOUDY (*)    Specific Gravity, Urine 1.034 (*)    Bilirubin Urine SMALL (*)    Ketones, ur 15 (*)    All other components within normal limits  LIPASE, BLOOD  CBC  HCG, QUANTITATIVE, PREGNANCY    Imaging Review No results found. I have personally reviewed and evaluated these images and lab results as part of my medical decision-making.   EKG Interpretation None      MDM   Final diagnoses:  Nausea vomiting and diarrhea   29 year old female here with nausea, vomiting, and diarrhea. 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 Patient is afebrile, nontoxic. Her abdominal exam is benign. She reports a mild sore throat, oropharynx does appear irritated on exam but tonsils are normal without signs of strep throat. Irritation is likely due to vomiting.  Patient is not in any acute distress, VSS. Lab work is reassuring.  Her constellation of symptoms are likely viral in nature, will discharge home with supportive care including zofran and imodium.  Discussed plan with patient, he/she acknowledged understanding and agreed with plan of care.  Return precautions given for new or worsening symptoms.  Garlon HatchetLisa M Sanders, PA-C 02/18/15 0112  Garlon HatchetLisa M Sanders, PA-C 02/18/15 0113  Azalia BilisKevin Campos, MD 02/18/15 [phone]

## 2015-02-18 NOTE — ED Notes (Signed)
Pt verbalized understanding of d/c instructions and has no further questions. Pt stable and NAD.  

## 2016-01-30 MED FILL — FLUOCINONIDE 0.05% CREAM: 0.05 | 28 days supply | Qty: 60 | Fill #0

## 2016-01-30 MED FILL — FLUTICASONE PROP 0.05% CRM: 0.05 | 28 days supply | Qty: 30 | Fill #0

## 2016-09-30 LAB — OB RESULTS CONSOLE ABO/RH: "RH Type ": POSITIVE

## 2016-09-30 LAB — OB RESULTS CONSOLE RPR: RPR: NONREACTIVE

## 2016-09-30 LAB — OB RESULTS CONSOLE HEPATITIS B SURFACE ANTIGEN: Hepatitis B Surface Ag: NEGATIVE

## 2016-09-30 LAB — OB RESULTS CONSOLE RUBELLA ANTIBODY, IGM: Rubella: IMMUNE

## 2016-10-11 MED FILL — FLUTICASONE PROP 0.05% CRM: 0.05 | 28 days supply | Qty: 30 | Fill #1

## 2016-10-11 MED FILL — FLUOCINONIDE 0.05% CREAM: 0.05 | 28 days supply | Qty: 60 | Fill #1

## 2016-10-21 MED FILL — OFLOXACIN 0.3% EAR DROPS: 0.3 | 10 days supply | Qty: 10 | Fill #0

## 2016-10-27 ENCOUNTER — Encounter (HOSPITAL_COMMUNITY): Payer: Self-pay | Admitting: Emergency Medicine

## 2016-10-27 ENCOUNTER — Emergency Department (HOSPITAL_COMMUNITY)
Admission: EM | Admit: 2016-10-27 | Discharge: 2016-10-27 | Disposition: A | Discharge status: Home / Self Care | Payer: 59 | Attending: Emergency Medicine | Admitting: Emergency Medicine

## 2016-10-27 DIAGNOSIS — H6062 Unspecified chronic otitis externa, left ear: Secondary | ICD-10-CM | POA: Diagnosis not present

## 2016-10-27 DIAGNOSIS — H60312 Diffuse otitis externa, left ear: Secondary | ICD-10-CM

## 2016-10-27 DIAGNOSIS — H9202 Otalgia, left ear: Secondary | ICD-10-CM | POA: Diagnosis present

## 2016-10-27 MED ORDER — CEFDINIR 300 MG PO CAPS: 300.0000 mg | ORAL_CAPSULE | Freq: Two times a day (BID) | ORAL | 0 refills | Status: DC

## 2016-10-27 MED ORDER — ACETAMINOPHEN-CODEINE #3 300-30 MG PO TABS: 1.0000 | ORAL_TABLET | Freq: Three times a day (TID) | ORAL | 0 refills | Status: DC | PRN

## 2016-10-27 NOTE — ED Triage Notes (Signed)
Pt to ED c/o continued L ear pain x 1 month. 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 She states abx drops have not helped. She reports decreased hearing on L side and the pain is now shooting down her L jawline. Pt endorses intermittent low-grade fevers.

## 2016-10-27 NOTE — Discharge Instructions (Signed)
Return here as needed.  Follow-up with the ear, nose and throat Dr. Provided.

## 2016-10-27 NOTE — ED Provider Notes (Signed)
[facility]-EMERGENCY DEPT Provider Note   CSN: [id number] Arrival date & time: 10/27/16  0120     History   Chief Complaint Chief Complaint  Patient presents with  . Otitis Media    HPI Margaret Little is a 31 y.o. female.  HPI Patient presents to the emergency department with left ear pain over the last month.  Patient states that she has been seen twice at an urgent care and by her primary doctor.  The patient states that she has been having shooting pains from her ear down into her jaw.  The patient states that he had about a drops have not been helping.  She states that Tylenol does not help with her pain. The patient denies chest pain, shortness of breath, headache,blurred vision, neck pain, fever, cough, weakness, numbness, dizziness, anorexia, edema, abdominal pain, nausea, vomiting, diarrhea, rash, back pain, dysuria, hematemesis, bloody stool, near syncope, or syncope.   sHistory reviewed. No pertinent past medical history.  There are no active problems to display for this patient.   History reviewed. No pertinent surgical history.  OB History    Gravida Para Term Preterm AB Living   1             SAB TAB Ectopic Multiple Live Births                   Home Medications    Prior to Admission medications   Medication Sig Start Date End Date Taking? Authorizing Provider  azithromycin (ZITHROMAX) 250 MG tablet Take 1 tablet (250 mg total) by mouth daily. Take first 2 tablets together, then 1 every day until finished. 10/15/14   Muthersbaugh, Dahlia Client, PA-C  chlorpheniramine-HYDROcodone (TUSSIONEX PENNKINETIC ER) 10-8 MG/5ML LQCR Take 5 mLs by mouth every 12 (twelve) hours as needed for cough. 03/03/13   Baker, Zachary H, PA-C  FOLIC ACID PO Take by mouth daily.    [provider]  HYDROcodone-acetaminophen (NORCO/VICODIN) 5-325 MG per tablet Take 1 tablet by mouth every 8 (eight) hours as needed for moderate pain or severe pain. 10/15/14   Muthersbaugh, Dahlia Client, PA-C    ipratropium (ATROVENT) 0.06 % nasal spray Place 2 sprays into both nostrils 4 (four) times daily. 03/03/13   Baker, Adrian Blackwater, PA-C  Lisdexamfetamine Dimesylate (VYVANSE PO) Take 100 mg by mouth daily.    [provider]  loperamide (IMODIUM) 2 MG capsule Take 1 capsule (2 mg total) by mouth 4 (four) times daily as needed for diarrhea or loose stools. 02/18/15   Garlon Hatchet, PA-C  methotrexate (RHEUMATREX) 2.5 MG tablet Take 2.5 mg by mouth as directed. Caution:Chemotherapy. Protect from light.    [provider]  norethindrone-ethinyl estradiol-iron (ESTROSTEP FE,TILIA FE,TRI-LEGEST FE) 1-20/1-30/1-35 MG-MCG tablet Take 1 tablet by mouth daily.    [provider]  ondansetron (ZOFRAN ODT) 4 MG disintegrating tablet Take 1 tablet (4 mg total) by mouth every 8 (eight) hours as needed for nausea. 02/18/15   Garlon Hatchet, PA-C    Family History No family history on file.  Social History Social History  Substance Use Topics  . Smoking status: Never Smoker  . Smokeless tobacco: Never Used  . Alcohol use No     Allergies   Amoxicillin and Penicillins   Review of Systems Review of Systems  All other systems negative except as documented in the HPI. All pertinent positives and negatives as reviewed in the HPI. Physical Exam Updated Vital Signs BP 121/79   Pulse 73  Temp 98 F (36.7 C) (Oral)   Resp 18   Ht 5\' 7"  (1.702 m)   Wt 123.8 kg (273 lb)   LMP  (LMP Unknown) Comment: "10 weeks" as of today  SpO2 100%   BMI 42.76 kg/m   Physical Exam  Constitutional: She is oriented to person, place, and time. She appears well-developed and well-nourished. No distress.  HENT:  Head: Normocephalic and atraumatic.  Right Ear: External ear normal.  Left Ear: External ear normal. There is drainage.  Ears:  Eyes: Pupils are equal, round, and reactive to light.  Pulmonary/Chest: Effort normal.  Neurological: She is alert and oriented to person, place,  and time.  Skin: Skin is warm and dry.  Psychiatric: She has a normal mood and affect.  Nursing note and vitals reviewed.    ED Treatments / Results  Labs (all labs ordered are listed, but only abnormal results are displayed) Labs Reviewed - No data to display  EKG  EKG Interpretation None       Radiology No results found.  Procedures Procedures (including critical care time)  Medications Ordered in ED Medications - No data to display   Initial Impression / Assessment and Plan / ED Course  I have reviewed the triage vital signs and the nursing notes.  Pertinent labs & imaging results that were available during my care of the patient were reviewed by me and considered in my medical decision making (see chart for details).     We will treat with oral antibiotics due to the prolonged nature and have her follow-up with ear, nose and throat.  Patient is advised return here as needed.  Patient agrees.  Plan and all questions were answered   Final Clinical Impressions(s) / ED Diagnoses   Final diagnoses:  None    New Prescriptions New Prescriptions   No medications on file     Charlestine NightLawyer, Ife Vitelli, PA-C 10/27/16 [phone]    Alvira MondaySchlossman, Erin, MD 10/31/16 1537

## 2016-10-29 LAB — OB RESULTS CONSOLE HIV ANTIBODY (ROUTINE TESTING): HIV: NONREACTIVE

## 2016-12-30 ENCOUNTER — Inpatient Hospital Stay (HOSPITAL_COMMUNITY)
Admission: AD | Admit: 2016-12-30 | Discharge: 2016-12-30 | Disposition: A | Discharge status: Home / Self Care | Payer: Medicaid Other | Source: Ambulatory Visit | Attending: Obstetrics and Gynecology | Admitting: Obstetrics and Gynecology

## 2016-12-30 ENCOUNTER — Encounter (HOSPITAL_COMMUNITY): Payer: Self-pay | Admitting: *Deleted

## 2016-12-30 ENCOUNTER — Inpatient Hospital Stay (HOSPITAL_COMMUNITY): Payer: Medicaid Other

## 2016-12-30 DIAGNOSIS — Z0375 Encounter for suspected cervical shortening ruled out: Secondary | ICD-10-CM

## 2016-12-30 DIAGNOSIS — O26892 Other specified pregnancy related conditions, second trimester: Secondary | ICD-10-CM

## 2016-12-30 DIAGNOSIS — Z3A20 20 weeks gestation of pregnancy: Secondary | ICD-10-CM | POA: Diagnosis not present

## 2016-12-30 DIAGNOSIS — O4692 Antepartum hemorrhage, unspecified, second trimester: Secondary | ICD-10-CM

## 2016-12-30 DIAGNOSIS — Z880 Allergy status to penicillin: Secondary | ICD-10-CM | POA: Insufficient documentation

## 2016-12-30 DIAGNOSIS — R109 Unspecified abdominal pain: Secondary | ICD-10-CM

## 2016-12-30 NOTE — Discharge Instructions (Signed)
Vaginal Bleeding During Pregnancy, Second Trimester A small amount of bleeding (spotting) from the vagina is common in pregnancy. Sometimes the bleeding is normal and is not a problem, and sometimes it is a sign of something serious. Be sure to tell your doctor about any bleeding from your vagina right away. Follow these instructions at home:  Watch your condition for any changes.  Follow your doctor's instructions about how active you can be.  If you are on bed rest: ? You may need to stay in bed and only get up to use the bathroom. ? You may be allowed to do some activities. ? If you need help, make plans for someone to help you.  Write down: ? The number of pads you use each day. ? How often you change pads. ? How soaked (saturated) your pads are.  Do not use tampons.  Do not douche.  Do not have sex or orgasms until your doctor says it is okay.  If you pass any tissue from your vagina, save the tissue so you can show it to your doctor.  Only take medicines as told by your doctor.  Do not take aspirin because it can make you bleed.  Do not exercise, lift heavy weights, or do any activities that take a lot of energy and effort unless your doctor says it is okay.  Keep all follow-up visits as told by your doctor. Contact a doctor if:  You bleed from your vagina.  You have cramps.  You have labor pains.  You have a fever that does not go away after you take medicine. Get help right away if:  You have very bad cramps in your back or belly (abdomen).  You have contractions.  You have chills.  You pass large clots or tissue from your vagina.  You bleed more.  You feel light-headed or weak.  You pass out (faint).  You are leaking fluid or have a gush of fluid from your vagina. This information is not intended to replace advice given to you by your health care provider. Make sure you discuss any questions you have with your health care provider. Document  Released: 06/25/2013 Document Revised: 07/17/2015 Document Reviewed: 10/16/2012 Elsevier Interactive Patient Education  2018 [employer] Inc[employer]  

## 2016-12-30 NOTE — Progress Notes (Signed)
lori clemmon cnm notified of pt arrival and presenting complaints. Plans to come to [facility] to evaluate

## 2016-12-30 NOTE — MAU Provider Note (Signed)
History     CSN: [id number]  Arrival date and time: 12/30/16 2027   First Provider Initiated Contact with Patient 12/30/16 2058      Chief Complaint  Patient presents with  . Vaginal Bleeding   Shella MaximMorgan C Brown 31 y.o. G1P0 @ [id number] pt of [facility], presents with 2nd episode of vaginal bleeding in the past week  1/2. Pt was working today and states she had moderate bleeding episode with no abdominal cramping around 700pm-830pm      Past Medical History:  Diagnosis Date  . Medical history non-contributory     Past Surgical History:  Procedure Laterality Date  . WISDOM TOOTH EXTRACTION      No family history on file.  Social History   Tobacco Use  . Smoking status: Never Smoker  . Smokeless tobacco: Never Used  Substance Use Topics  . Alcohol use: No  . Drug use: No    Allergies:  Allergies  Allergen Reactions  . Amoxicillin Hives  . Penicillins Hives    Medications Prior to Admission  Medication Sig Dispense Refill Last Dose  . acetaminophen-codeine (TYLENOL #3) 300-30 MG tablet Take 1-2 tablets by mouth every 8 (eight) hours as needed for moderate pain. 10 tablet 0   . azithromycin (ZITHROMAX) 250 MG tablet Take 1 tablet (250 mg total) by mouth daily. Take first 2 tablets together, then 1 every day until finished. 6 tablet 0   . cefdinir (OMNICEF) 300 MG capsule Take 1 capsule (300 mg total) by mouth 2 (two) times daily. 14 capsule 0   . chlorpheniramine-HYDROcodone (TUSSIONEX PENNKINETIC ER) 10-8 MG/5ML LQCR Take 5 mLs by mouth every 12 (twelve) hours as needed for cough. 115 mL 0   . FOLIC ACID PO Take by mouth daily.     Marland Kitchen. HYDROcodone-acetaminophen (NORCO/VICODIN) 5-325 MG per tablet Take 1 tablet by mouth every 8 (eight) hours as needed for moderate pain or severe pain. 7 tablet 0   . ipratropium (ATROVENT) 0.06 % nasal spray Place 2 sprays into both nostrils 4 (four) times daily. 15 mL 12   . Lisdexamfetamine Dimesylate (VYVANSE PO) Take 100 mg by mouth  daily.     Marland Kitchen. loperamide (IMODIUM) 2 MG capsule Take 1 capsule (2 mg total) by mouth 4 (four) times daily as needed for diarrhea or loose stools. 12 capsule 0   . methotrexate (RHEUMATREX) 2.5 MG tablet Take 2.5 mg by mouth as directed. Caution:Chemotherapy. Protect from light.     . norethindrone-ethinyl estradiol-iron (ESTROSTEP FE,TILIA FE,TRI-LEGEST FE) 1-20/1-30/1-35 MG-MCG tablet Take 1 tablet by mouth daily.     . ondansetron (ZOFRAN ODT) 4 MG disintegrating tablet Take 1 tablet (4 mg total) by mouth every 8 (eight) hours as needed for nausea. 12 tablet 0     Review of Systems  Genitourinary: Positive for vaginal bleeding.  All other systems reviewed and are negative.  Physical Exam   Blood pressure 125/90, pulse 79, temperature 98.3 F (36.8 C), temperature source Oral, resp. rate 18, height 5\' 7"  (1.702 m), weight 276 lb 4 oz (125.3 kg).  Physical Exam  Nursing note and vitals reviewed. Constitutional: She is oriented to person, place, and time. She appears well-developed and well-nourished.  HENT:  Head: Normocephalic and atraumatic.  Cardiovascular: Normal rate and regular rhythm.  Respiratory: Effort normal and breath sounds normal. No respiratory distress.  GI: Soft. Bowel sounds are normal.  Genitourinary: Vagina normal.  Genitourinary Comments: Sterile spec exam - no blood in vault  Musculoskeletal: Normal  range of motion.  Neurological: She is alert and oriented to person, place, and time.  Skin: Skin is warm and dry.  Psychiatric: She has a normal mood and affect. Her behavior is normal. Thought content normal.    MAU Course  Procedures  MDM Positive FHt's- Final Report of Ultrasound pending: Preliminary Report wnl. Cervical length 3.4cm  Assessment and Plan  Vaginal Bleeding in 2nd trimester  Sterile Spec Exam Ultrasound for bleeding and cervical length Discharge to home with bleeding precautions  Return to office at regular scheduled appointment  [facility]  Fabrizzio Marcella CNM    Lawson FiscalLori A Zayne Draheim 12/30/2016, 9:51 PM

## 2016-12-30 NOTE — MAU Note (Signed)
PT  SAYS SHE HAD  SPOTTING  AT 0630   TODAY   WHEN  SHE  WIPED , NO SEX.        WENT  TO B-ROOM AT 8PM-   NO SPOTTING.     THIS SAME  SITUATION  HAPPENED  2 WEEKS  AGO.      WENT  TO DR ON Tuesday - ALL OK.      NO PAIN, NO CRAMPS

## 2017-02-22 NOTE — L&D Delivery Note (Signed)
Delivery Note At  0310a viable female was delivered via  (Presentation:vtx ;loa  ).  APGAR: , ; weight  .  pending Placenta status: intact,sent to pathology .  Cord: 3 vessles with the following complications: maternal temp; suspected chorioamnionitis.   Anesthesia:  Epidural Episiotomy:   Lacerations:  vulvar varicosity Suture Repair: 3.0 vicryl Est. Blood Loss (mL):  100  Mom to postpartum.  Baby to Couplet care / Skin to Skin. Out patient circ [facility]; Dr Kateri PlummerVarnardo Jhair Witherington A Adolfo Granieri CNM 04/28/2017, 3:27 AM

## 2017-03-09 ENCOUNTER — Encounter: Payer: 59 | Attending: Obstetrics and Gynecology | Admitting: Registered"

## 2017-03-09 DIAGNOSIS — O24419 Gestational diabetes mellitus in pregnancy, unspecified control: Secondary | ICD-10-CM | POA: Diagnosis present

## 2017-03-09 DIAGNOSIS — Z713 Dietary counseling and surveillance: Secondary | ICD-10-CM | POA: Diagnosis present

## 2017-03-09 DIAGNOSIS — R7309 Other abnormal glucose: Secondary | ICD-10-CM

## 2017-03-10 MED FILL — ONE TOUCH ULTRA TEST STRIPS: 12 days supply | Qty: 50 | Fill #0

## 2017-03-10 MED FILL — ONE TOUCH ULTRAMINI METER: W/DEVICE | 30 days supply | Qty: 1 | Fill #0

## 2017-03-10 MED FILL — ONE TOUCH DELICA 33G LANCET: 25 days supply | Qty: 100 | Fill #0

## 2017-03-11 ENCOUNTER — Encounter: Payer: Self-pay | Admitting: Registered"

## 2017-03-11 NOTE — Progress Notes (Signed)
Patient was seen on 03/09/2017 for Gestational Diabetes self-management class at the [facility]. The following learning objectives were met by the patient during this course:   States the definition of Gestational Diabetes  States why dietary management is important in controlling blood glucose  Describes the effects each nutrient has on blood glucose levels  Demonstrates ability to create a balanced meal plan  Demonstrates carbohydrate counting   States when to check blood glucose levels  Demonstrates proper blood glucose monitoring techniques  States the effect of stress and exercise on blood glucose levels  States the importance of limiting caffeine and abstaining from alcohol and smoking  Blood glucose monitor given: none Lot # n/a Exp: n/a Blood glucose reading: n/a  Patient instructed to monitor glucose levels: FBS: 60 - <95 1 hour: <140 2 hour: <120  Patient received handouts:  Nutrition Diabetes and Pregnancy  Carbohydrate Counting List  Patient will be seen for follow-up as needed. 

## 2017-03-18 MED FILL — glyBURIDE 1.25 MG TABS: 1.25 | 30 days supply | Qty: 30 | Fill #0

## 2017-03-25 MED FILL — ONE TOUCH ULTRA TEST STRIPS: 12 days supply | Qty: 50 | Fill #1

## 2017-04-04 MED FILL — glyBURIDE 2.5 MG TABS: 2.5 | 30 days supply | Qty: 30 | Fill #0

## 2017-04-25 ENCOUNTER — Other Ambulatory Visit: Payer: Self-pay

## 2017-04-25 ENCOUNTER — Inpatient Hospital Stay (EMERGENCY_DEPARTMENT_HOSPITAL)
Admission: AD | Admit: 2017-04-25 | Discharge: 2017-04-25 | Disposition: A | Discharge status: Home / Self Care | Payer: 59 | Source: Ambulatory Visit | Attending: Obstetrics and Gynecology | Admitting: Obstetrics and Gynecology

## 2017-04-25 ENCOUNTER — Encounter (HOSPITAL_COMMUNITY): Payer: Self-pay | Admitting: *Deleted

## 2017-04-25 ENCOUNTER — Ambulatory Visit (INDEPENDENT_AMBULATORY_CARE_PROVIDER_SITE_OTHER): Payer: Self-pay | Admitting: Pediatrics

## 2017-04-25 DIAGNOSIS — Z3A37 37 weeks gestation of pregnancy: Secondary | ICD-10-CM

## 2017-04-25 DIAGNOSIS — O26893 Other specified pregnancy related conditions, third trimester: Secondary | ICD-10-CM

## 2017-04-25 DIAGNOSIS — Z8632 Personal history of gestational diabetes: Secondary | ICD-10-CM

## 2017-04-25 DIAGNOSIS — O133 Gestational [pregnancy-induced] hypertension without significant proteinuria, third trimester: Secondary | ICD-10-CM | POA: Insufficient documentation

## 2017-04-25 DIAGNOSIS — Z79899 Other long term (current) drug therapy: Secondary | ICD-10-CM | POA: Insufficient documentation

## 2017-04-25 DIAGNOSIS — O471 False labor at or after 37 completed weeks of gestation: Secondary | ICD-10-CM | POA: Diagnosis not present

## 2017-04-25 DIAGNOSIS — O134 Gestational [pregnancy-induced] hypertension without significant proteinuria, complicating childbirth: Secondary | ICD-10-CM | POA: Diagnosis not present

## 2017-04-25 DIAGNOSIS — Z880 Allergy status to penicillin: Secondary | ICD-10-CM | POA: Insufficient documentation

## 2017-04-25 DIAGNOSIS — Z7681 Expectant parent(s) prebirth pediatrician visit: Secondary | ICD-10-CM

## 2017-04-25 DIAGNOSIS — R109 Unspecified abdominal pain: Secondary | ICD-10-CM

## 2017-04-25 DIAGNOSIS — Z3689 Encounter for other specified antenatal screening: Secondary | ICD-10-CM

## 2017-04-25 HISTORY — DX: Gestational diabetes mellitus in pregnancy, unspecified control: O24.419

## 2017-04-25 LAB — CBC
HCT: 36.3 % (ref 36.0–46.0)
Hemoglobin: 12.3 g/dL (ref 12.0–15.0)
MCH: 27.5 pg (ref 26.0–34.0)
MCHC: 33.9 g/dL (ref 30.0–36.0)
MCV: 81 fL (ref 78.0–100.0)
Platelets: 227 10*3/uL (ref 150–400)
RBC: 4.48 MIL/uL (ref 3.87–5.11)
RDW: 14.5 % (ref 11.5–15.5)
WBC: 13.7 10*3/uL — ABNORMAL HIGH (ref 4.0–10.5)

## 2017-04-25 LAB — COMPREHENSIVE METABOLIC PANEL
ALT: 12 U/L — ABNORMAL LOW (ref 14–54)
AST: 16 U/L (ref 15–41)
Albumin: 2.5 g/dL — ABNORMAL LOW (ref 3.5–5.0)
Alkaline Phosphatase: 127 U/L — ABNORMAL HIGH (ref 38–126)
Anion gap: 6 (ref 5–15)
BUN: 9 mg/dL (ref 6–20)
CO2: 23 mmol/L (ref 22–32)
Calcium: 9 mg/dL (ref 8.9–10.3)
Chloride: 105 mmol/L (ref 101–111)
Creatinine, Ser: 0.72 mg/dL (ref 0.44–1.00)
GFR calc Af Amer: >60 mL/min (ref >60)
GFR calc non Af Amer: >60 mL/min (ref >60)
Glucose, Bld: 88 mg/dL (ref 65–99)
Potassium: 4.5 mmol/L (ref 3.5–5.1)
Sodium: 134 mmol/L — ABNORMAL LOW (ref 135–145)
Total Bilirubin: 0.4 mg/dL (ref 0.3–1.2)
Total Protein: 6.5 g/dL (ref 6.5–8.1)

## 2017-04-25 LAB — PROTEIN / CREATININE RATIO, URINE
Creatinine, Urine: 78 mg/dL
Protein Creatinine Ratio: 0.14 mg/mg{Cre} (ref 0.00–0.15)
Total Protein, Urine: 11 mg/dL

## 2017-04-25 NOTE — MAU Note (Signed)
Contractions started this morning at 10. Now every 1-435min.  Was closed last wk. Had some spotting earlier, but has stopped. Pt has GDM,  BP starting to creap up.

## 2017-04-25 NOTE — Discharge Instructions (Signed)
monitor BP at home at least 3 times a day. Call the office if BP is over 150/100. Watch for headache, blurred vision, or RUQ pain and return if these develop. Call and schedule an appointment to be seen in the office on Thursday.   Do not go to work. Drink at least 8 8-oz glasses of water every day. Take Tylenol 325 mg 2 tablets by mouth every 4 hours if needed for pain. Return if in labor. Expect your baby to move every day.  Return if movement is decreased.

## 2017-04-25 NOTE — MAU Note (Signed)
Urine is in the Lab 

## 2017-04-25 NOTE — MAU Provider Note (Signed)
History     CSN: [id number]  Arrival date and time: 04/25/17 1752   First Provider Initiated Contact with Patient 04/25/17 1849      Chief Complaint  Patient presents with  . Contractions   HPI Margaret Little 31 y.o. [redacted]w[redacted]d  Having contractions and came in for a labor check.  Blood pressure was elevated.  Will evaluate blood pressures and labs.    OB History    Gravida Para Term Preterm AB Living   1             SAB TAB Ectopic Multiple Live Births                  Past Medical History:  Diagnosis Date  . Gestational diabetes     Past Surgical History:  Procedure Laterality Date  . WISDOM TOOTH EXTRACTION      History reviewed. No pertinent family history.  Social History   Tobacco Use  . Smoking status: Never Smoker  . Smokeless tobacco: Never Used  Substance Use Topics  . Alcohol use: No  . Drug use: No    Allergies:  Allergies  Allergen Reactions  . Amoxicillin Hives  . Penicillins Hives    Medications Prior to Admission  Medication Sig Dispense Refill Last Dose  . acetaminophen-codeine (TYLENOL #3) 300-30 MG tablet Take 1-2 tablets by mouth every 8 (eight) hours as needed for moderate pain. 10 tablet 0   . azithromycin (ZITHROMAX) 250 MG tablet Take 1 tablet (250 mg total) by mouth daily. Take first 2 tablets together, then 1 every day until finished. 6 tablet 0   . cefdinir (OMNICEF) 300 MG capsule Take 1 capsule (300 mg total) by mouth 2 (two) times daily. 14 capsule 0   . chlorpheniramine-HYDROcodone (TUSSIONEX PENNKINETIC ER) 10-8 MG/5ML LQCR Take 5 mLs by mouth every 12 (twelve) hours as needed for cough. 115 mL 0   . FOLIC ACID PO Take by mouth daily.     Marland Kitchen HYDROcodone-acetaminophen (NORCO/VICODIN) 5-325 MG per tablet Take 1 tablet by mouth every 8 (eight) hours as needed for moderate pain or severe pain. 7 tablet 0   . ipratropium (ATROVENT) 0.06 % nasal spray Place 2 sprays into both nostrils 4 (four) times daily. 15 mL 12   .  Lisdexamfetamine Dimesylate (VYVANSE PO) Take 100 mg by mouth daily.     Marland Kitchen loperamide (IMODIUM) 2 MG capsule Take 1 capsule (2 mg total) by mouth 4 (four) times daily as needed for diarrhea or loose stools. 12 capsule 0   . methotrexate (RHEUMATREX) 2.5 MG tablet Take 2.5 mg by mouth as directed. Caution:Chemotherapy. Protect from light.     . norethindrone-ethinyl estradiol-iron (ESTROSTEP FE,TILIA FE,TRI-LEGEST FE) 1-20/1-30/1-35 MG-MCG tablet Take 1 tablet by mouth daily.     . ondansetron (ZOFRAN ODT) 4 MG disintegrating tablet Take 1 tablet (4 mg total) by mouth every 8 (eight) hours as needed for nausea. 12 tablet 0     Review of Systems  Constitutional: Negative for fever.  Gastrointestinal: Positive for abdominal pain. Negative for nausea and vomiting.  Genitourinary: Negative for dysuria, vaginal bleeding and vaginal discharge.  Neurological: Negative for dizziness, weakness and headaches.   Physical Exam   Blood pressure (!) 150/102, pulse 80, temperature 97.9 F (36.6 C), temperature source Oral, resp. rate 18, weight 293 lb (132.9 kg), SpO2 99 %.  Physical Exam  Nursing note and vitals reviewed. Constitutional: She is oriented to person, place, and time. She appears well-developed and  well-nourished.  HENT:  Head: Normocephalic.  Eyes: EOM are normal.  Neck: Neck supple.  Respiratory: Effort normal.  GI: Soft. There is no tenderness. There is no rebound and no guarding.  No RUQ pain FHT baseline is 130 with moderate variability and 15x15 accels noted.  Contractions are irregular and becoming more mild.  No decelerations.  Reactive NST  Genitourinary:  Genitourinary Comments: Cervical exam by RN, posterior cervix, internal os is closed.  Musculoskeletal: Normal range of motion. She exhibits no edema.  Neurological: She is alert and oriented to person, place, and time.  Skin: Skin is warm and dry.  Psychiatric: She has a normal mood and affect.    MAU Course   Procedures Results for orders placed or performed during the hospital encounter of 04/25/17 (from the past 24 hour(s))  Protein / creatinine ratio, urine     Status: None   Collection Time: 04/25/17  6:10 PM  Result Value Ref Range   Creatinine, Urine 78.00 mg/dL   Total Protein, Urine 11 mg/dL   Protein Creatinine Ratio 0.14 0.00 - 0.15 mg/mg[Cre]  CBC     Status: Abnormal   Collection Time: 04/25/17  7:27 PM  Result Value Ref Range   WBC 13.7 (H) 4.0 - 10.5 K/uL   RBC 4.48 3.87 - 5.11 MIL/uL   Hemoglobin 12.3 12.0 - 15.0 g/dL   HCT 91.436.3 78.236.0 - 95.646.0 %   MCV 81.0 78.0 - 100.0 fL   MCH 27.5 26.0 - 34.0 pg   MCHC 33.9 30.0 - 36.0 g/dL   RDW 21.314.5 08.611.5 - 57.815.5 %   Platelets 227 150 - 400 K/uL  Comprehensive metabolic panel     Status: Abnormal   Collection Time: 04/25/17  7:27 PM  Result Value Ref Range   Sodium 134 (L) 135 - 145 mmol/L   Potassium 4.5 3.5 - 5.1 mmol/L   Chloride 105 101 - 111 mmol/L   CO2 23 22 - 32 mmol/L   Glucose, Bld 88 65 - 99 mg/dL   BUN 9 6 - 20 mg/dL   Creatinine, Ser 4.690.72 0.44 - 1.00 mg/dL   Calcium 9.0 8.9 - 62.910.3 mg/dL   Total Protein 6.5 6.5 - 8.1 g/dL   Albumin 2.5 (L) 3.5 - 5.0 g/dL   AST 16 15 - 41 U/L   ALT 12 (L) 14 - 54 U/L   Alkaline Phosphatase 127 (H) 38 - 126 U/L   Total Bilirubin 0.4 0.3 - 1.2 mg/dL   GFR calc non Af Amer >60 >60 mL/min   GFR calc Af Amer >60 >60 mL/min   Anion gap 6 5 - 15    MDM Consulted with Dr. Charlotta Newtonzan.  Client reports she is anxious and that is why her BPs are elevated.  Serial BPs over time - initial BPs as high as 153/103 but then was 111/87.  Latest BPs hasve been 142/93, 145/94 and 132.93.  None have been in the severe range.  Will send home with home BP monitoring at least 3 times a day.  To report to the office any BPs over 150./100.  Assessment and Plan  Gestational Hypertension with mild BP elevation and normal labs  - otherwise asymptomatic Reactive NST Cervix - internal os is closed  Plan To monitor  BP at home at least 3 times a day. Call the office if BP is over 150/100. Watch for headache, blurred vision, or RUQ pain and return if these develop. Call and schedule an appointment to be  seen in the office on Thursday.   Do not go to work. Drink at least 8 8-oz glasses of water every day. Take Tylenol 325 mg 2 tablets by mouth every 4 hours if needed for pain. Return if in labor. Expect your baby to move every day.  Return if movement is decreased.    Terri L Burleson 04/25/2017, 8:04 PM

## 2017-04-26 ENCOUNTER — Inpatient Hospital Stay (HOSPITAL_COMMUNITY)
Admission: AD | Admit: 2017-04-26 | Discharge: 2017-04-30 | DRG: 805 | Disposition: A | Discharge status: Home / Self Care | Payer: 59 | Source: Ambulatory Visit | Attending: Obstetrics and Gynecology | Admitting: Obstetrics and Gynecology

## 2017-04-26 ENCOUNTER — Other Ambulatory Visit: Payer: Self-pay | Admitting: Obstetrics and Gynecology

## 2017-04-26 ENCOUNTER — Encounter (HOSPITAL_COMMUNITY): Payer: Self-pay | Admitting: *Deleted

## 2017-04-26 DIAGNOSIS — O24425 Gestational diabetes mellitus in childbirth, controlled by oral hypoglycemic drugs: Secondary | ICD-10-CM | POA: Diagnosis present

## 2017-04-26 DIAGNOSIS — O134 Gestational [pregnancy-induced] hypertension without significant proteinuria, complicating childbirth: Secondary | ICD-10-CM | POA: Diagnosis present

## 2017-04-26 DIAGNOSIS — Z3A37 37 weeks gestation of pregnancy: Secondary | ICD-10-CM | POA: Diagnosis not present

## 2017-04-26 DIAGNOSIS — O411230 Chorioamnionitis, third trimester, not applicable or unspecified: Secondary | ICD-10-CM | POA: Diagnosis present

## 2017-04-26 DIAGNOSIS — Z880 Allergy status to penicillin: Secondary | ICD-10-CM

## 2017-04-26 DIAGNOSIS — O99824 Streptococcus B carrier state complicating childbirth: Secondary | ICD-10-CM | POA: Diagnosis present

## 2017-04-26 DIAGNOSIS — O133 Gestational [pregnancy-induced] hypertension without significant proteinuria, third trimester: Secondary | ICD-10-CM | POA: Diagnosis present

## 2017-04-26 DIAGNOSIS — O878 Other venous complications in the puerperium: Secondary | ICD-10-CM | POA: Diagnosis present

## 2017-04-26 DIAGNOSIS — O99214 Obesity complicating childbirth: Secondary | ICD-10-CM | POA: Diagnosis present

## 2017-04-26 DIAGNOSIS — O8612 Endometritis following delivery: Secondary | ICD-10-CM | POA: Diagnosis not present

## 2017-04-26 HISTORY — DX: Unspecified abnormal cytological findings in specimens from vagina: R87.629

## 2017-04-26 HISTORY — DX: Headache, unspecified: R51.9

## 2017-04-26 HISTORY — DX: Essential (primary) hypertension: I10

## 2017-04-26 HISTORY — DX: Headache: R51

## 2017-04-26 LAB — COMPREHENSIVE METABOLIC PANEL
ALT: 11 U/L — ABNORMAL LOW (ref 14–54)
AST: 16 U/L (ref 15–41)
Albumin: 2.5 g/dL — ABNORMAL LOW (ref 3.5–5.0)
Alkaline Phosphatase: 133 U/L — ABNORMAL HIGH (ref 38–126)
Anion gap: 8 (ref 5–15)
BUN: 8 mg/dL (ref 6–20)
CO2: 21 mmol/L — ABNORMAL LOW (ref 22–32)
Calcium: 8.8 mg/dL — ABNORMAL LOW (ref 8.9–10.3)
Chloride: 107 mmol/L (ref 101–111)
Creatinine, Ser: 0.75 mg/dL (ref 0.44–1.00)
GFR calc Af Amer: >60 mL/min (ref >60)
GFR calc non Af Amer: >60 mL/min (ref >60)
Glucose, Bld: 111 mg/dL — ABNORMAL HIGH (ref 65–99)
Potassium: 4.2 mmol/L (ref 3.5–5.1)
Sodium: 136 mmol/L (ref 135–145)
Total Bilirubin: 0.4 mg/dL (ref 0.3–1.2)
Total Protein: 6.2 g/dL — ABNORMAL LOW (ref 6.5–8.1)

## 2017-04-26 LAB — TYPE AND SCREEN
ABO/RH(D): A POS
Antibody Screen: NEGATIVE

## 2017-04-26 LAB — CBC
HCT: 37.1 % (ref 36.0–46.0)
Hemoglobin: 12.3 g/dL (ref 12.0–15.0)
MCH: 27.2 pg (ref 26.0–34.0)
MCHC: 33.2 g/dL (ref 30.0–36.0)
MCV: 81.9 fL (ref 78.0–100.0)
Platelets: 235 10*3/uL (ref 150–400)
RBC: 4.53 MIL/uL (ref 3.87–5.11)
RDW: 14.6 % (ref 11.5–15.5)
WBC: 13.8 10*3/uL — ABNORMAL HIGH (ref 4.0–10.5)

## 2017-04-26 LAB — ABO/RH: ABO/RH(D): A POS

## 2017-04-26 LAB — PROTEIN / CREATININE RATIO, URINE
Creatinine, Urine: 147 mg/dL
Protein Creatinine Ratio: 0.11 mg/mg{Cre} (ref 0.00–0.15)
Total Protein, Urine: 16 mg/dL

## 2017-04-26 LAB — OB RESULTS CONSOLE GBS: GBS: POSITIVE

## 2017-04-26 MED ORDER — OXYCODONE-ACETAMINOPHEN 5-325 MG PO TABS: 1.0000 | ORAL_TABLET | ORAL | Status: DC | PRN

## 2017-04-26 MED ORDER — OXYTOCIN BOLUS FROM INFUSION
500.0000 mL | Freq: Once | INTRAVENOUS | Status: AC
  Administered 2017-04-28: 500 mL via INTRAVENOUS

## 2017-04-26 MED ORDER — VANCOMYCIN HCL IN DEXTROSE 1-5 GM/200ML-% IV SOLN
1000.0000 mg | Freq: Two times a day (BID) | INTRAVENOUS | Status: DC
  Filled 2017-04-26 (×2): qty 200

## 2017-04-26 MED ORDER — OXYTOCIN 40 UNITS IN LACTATED RINGERS INFUSION - SIMPLE MED
2.5000 [IU]/h | INTRAVENOUS | Status: DC
  Administered 2017-04-28: 2.5 [IU]/h via INTRAVENOUS
  Filled 2017-04-26: qty 1000

## 2017-04-26 MED ORDER — HYDRALAZINE HCL 20 MG/ML IJ SOLN: 5.0000 mg | INTRAMUSCULAR | Status: DC | PRN

## 2017-04-26 MED ORDER — ZOLPIDEM TARTRATE 5 MG PO TABS
5.0000 mg | ORAL_TABLET | Freq: Every evening | ORAL | Status: DC | PRN
  Administered 2017-04-26: 5 mg via ORAL
  Filled 2017-04-26: qty 1

## 2017-04-26 MED ORDER — OXYTOCIN 40 UNITS IN LACTATED RINGERS INFUSION - SIMPLE MED
1.0000 m[IU]/min | INTRAVENOUS | Status: DC
  Administered 2017-04-26: 1 m[IU]/min via INTRAVENOUS
  Filled 2017-04-26: qty 1000

## 2017-04-26 MED ORDER — ACETAMINOPHEN 325 MG PO TABS
650.0000 mg | ORAL_TABLET | ORAL | Status: DC | PRN
  Administered 2017-04-27: 650 mg via ORAL
  Filled 2017-04-26: qty 2

## 2017-04-26 MED ORDER — LACTATED RINGERS IV SOLN
500.0000 mL | INTRAVENOUS | Status: DC | PRN
  Administered 2017-04-27: 500 mL via INTRAVENOUS

## 2017-04-26 MED ORDER — BUTORPHANOL TARTRATE 1 MG/ML IJ SOLN
1.0000 mg | INTRAMUSCULAR | Status: DC | PRN
  Administered 2017-04-27: 1 mg via INTRAVENOUS
  Filled 2017-04-26: qty 1

## 2017-04-26 MED ORDER — TERBUTALINE SULFATE 1 MG/ML IJ SOLN
0.2500 mg | Freq: Once | INTRAMUSCULAR | Status: DC | PRN
  Filled 2017-04-26: qty 1

## 2017-04-26 MED ORDER — SOD CITRATE-CITRIC ACID 500-334 MG/5ML PO SOLN: 30.0000 mL | ORAL | Status: DC | PRN

## 2017-04-26 MED ORDER — LIDOCAINE HCL (PF) 1 % IJ SOLN
30.0000 mL | INTRAMUSCULAR | Status: DC | PRN
  Filled 2017-04-26: qty 30

## 2017-04-26 MED ORDER — ONDANSETRON HCL 4 MG/2ML IJ SOLN: 4.0000 mg | Freq: Four times a day (QID) | INTRAMUSCULAR | Status: DC | PRN

## 2017-04-26 MED ORDER — LABETALOL HCL 200 MG PO TABS
200.0000 mg | ORAL_TABLET | Freq: Three times a day (TID) | ORAL | Status: DC
  Filled 2017-04-26: qty 1

## 2017-04-26 MED ORDER — LACTATED RINGERS IV SOLN
INTRAVENOUS | Status: DC
  Administered 2017-04-26 – 2017-04-27 (×3): via INTRAVENOUS

## 2017-04-26 MED ORDER — MISOPROSTOL 25 MCG QUARTER TABLET
25.0000 ug | ORAL_TABLET | ORAL | Status: DC | PRN
  Filled 2017-04-26: qty 1

## 2017-04-26 MED ORDER — VANCOMYCIN HCL IN DEXTROSE 1-5 GM/200ML-% IV SOLN
1000.0000 mg | Freq: Two times a day (BID) | INTRAVENOUS | Status: DC
  Administered 2017-04-26 – 2017-04-28 (×4): 1000 mg via INTRAVENOUS
  Filled 2017-04-26 (×5): qty 200

## 2017-04-26 MED ORDER — OXYCODONE-ACETAMINOPHEN 5-325 MG PO TABS: 2.0000 | ORAL_TABLET | ORAL | Status: DC | PRN

## 2017-04-26 MED ORDER — LABETALOL HCL 5 MG/ML IV SOLN: 20.0000 mg | INTRAVENOUS | Status: DC | PRN

## 2017-04-26 MED ORDER — HYDROXYZINE HCL 50 MG PO TABS
50.0000 mg | ORAL_TABLET | Freq: Four times a day (QID) | ORAL | Status: DC | PRN
  Administered 2017-04-26: 50 mg via ORAL
  Filled 2017-04-26 (×2): qty 1

## 2017-04-26 NOTE — Anesthesia Pain Management Evaluation Note (Signed)
  CRNA Pain Management Visit Note  Patient: Margaret Little, 32 y.o., female  "Hello I am a member of the anesthesia team at [facility]. We have an anesthesia team available at all times to provide care throughout the hospital, including epidural management and anesthesia for C-section. I don't know your plan for the delivery whether it a natural birth, water birth, IV sedation, nitrous supplementation, doula or epidural, but we want to meet your pain goals."   1.Was your pain managed to your expectations on prior hospitalizations?   yes  2.What is your expectation for pain management during this hospitalization?     IV pain meds  3.How can we help you reach that goal? IV med and Nursing support  Record the patient's initial score and the patient's pain goal.   Pain: 0/10  Pain Goal: 0/10 [facility] wants you to be able to say your pain was always managed very well.  Margaret Little, Margaret Little 04/26/2017

## 2017-04-26 NOTE — Progress Notes (Addendum)
Pt started to feel contractions.  On 4 mUs per RN. 156/95 Gen:  NAD, appears comfortable. Cervix: 1/50%/--3, anterior near pubis Cat 1 tracing, contractions irregular  Foley bulb placed manually without difficulty, cervix prepped with betadine.  CMP     Component Value Date/Time   NA 136 04/26/2017 1400   K 4.2 04/26/2017 1400   CL 107 04/26/2017 1400   CO2 21 (L) 04/26/2017 1400   GLUCOSE 111 (H) 04/26/2017 1400   BUN 8 04/26/2017 1400   CREATININE 0.75 04/26/2017 1400   CALCIUM 8.8 (L) 04/26/2017 1400   PROT 6.2 (L) 04/26/2017 1400   ALBUMIN 2.5 (L) 04/26/2017 1400   AST 16 04/26/2017 1400   ALT 11 (L) 04/26/2017 1400   ALKPHOS 133 (H) 04/26/2017 1400   BILITOT 0.4 04/26/2017 1400   GFRNONAA >60 04/26/2017 1400   GFRAA >60 04/26/2017 1400   CBC    Component Value Date/Time   WBC 13.8 (H) 04/26/2017 1400   RBC 4.53 04/26/2017 1400   HGB 12.3 04/26/2017 1400   HCT 37.1 04/26/2017 1400   PLT 235 04/26/2017 1400   MCV 81.9 04/26/2017 1400   MCH 27.2 04/26/2017 1400   MCHC 33.2 04/26/2017 1400   RDW 14.6 04/26/2017 1400   PCR 0.11 (0.14 yesterday)  A/P IUP @ 37 2/[redacted] weeks Gestational HTN-Ruled out Preeclampsia. Gestational Diabetes GBS positive.  Resistant to Clindamycin. S/p Vancomycin x 1 dose. Morbid Obesity PCN allergy.  Foley bulb in place.  Low dose Pitocin, hold at 6 mUs until foley bulb expelled. Atarax for anxiety is helping.  Continue as needed. Pt does not want an epidural.  Stadol IV and Nitrous Oxide ordered.

## 2017-04-26 NOTE — H&P (Signed)
Margaret Little is a 32 y.o. female G1 @ 37 2/7 weeks  Revised by a 12 week ultrasound presenting for IOL due to Gestational HTN.  Pt had severe range pressures last night upon arrival to hospital for labor check.  BPs trended down, preeclampsia w/u neg.  This morning in the office BP was 140/100 and pt reported similar readings at home.  Pt asymptomatic.  Recommended IOL due Gest HTN.  [facility](Janilah Hojnacki).   Complicated by: Shelton SilvasGest DM-Well controlled with Glyburide 2.5 mg Morbid obesity  BMI 44 @ 270 lbs GBS positive, resistant to Clindamycin.  PCN allergy.   OB History    Gravida Para Term Preterm AB Living   1             SAB TAB Ectopic Multiple Live Births                 Past Medical History:  Diagnosis Date  . Gestational diabetes    Past Surgical History:  Procedure Laterality Date  . WISDOM TOOTH EXTRACTION     Family History: family history is not on file. Social History:  reports that  has never smoked. she has never used smokeless tobacco. She reports that she does not drink alcohol or use drugs.     Maternal Diabetes: Yes:  Diabetes Type:  Insulin/Medication controlled Genetic Screening: Normal Maternal Ultrasounds/Referrals: Normal Fetal Ultrasounds or other Referrals:  None Maternal Substance Abuse:  No Significant Maternal Medications:  Meds include: Other: Glyburide Significant Maternal Lab Results:  Lab values include: Group B Strep positive Other Comments:  Morbid obesity  Review of Systems  Eyes:       No visual changes  Gastrointestinal: Negative for abdominal pain.  Neurological: Negative for headaches.   Maternal Medical History:  Contractions: Onset was yesterday.   Perceived severity is mild.    Fetal activity: Perceived fetal activity is normal.    Prenatal complications: PIH.   Prenatal Complications - Diabetes: gestational. Diabetes is managed by oral agent (monotherapy).        There were no vitals taken for this  visit. Maternal Exam:  Uterine Assessment: Contraction strength is mild.  Contraction frequency is irregular.   Abdomen: Patient reports no abdominal tenderness. Estimated fetal weight is 6 lbs 13 oz on 04/25/17.   Fetal presentation: vertex  Introitus: Normal vulva. Pelvis: adequate for delivery.   Cervix: Cervix evaluated by digital exam.     Fetal Exam Fetal Monitor Review: Mode: hand-held doppler probe.   Baseline rate: 140s.      Physical Exam  Constitutional: She is oriented to person, place, and time. She appears well-developed and well-nourished. No distress.  HENT:  Head: Normocephalic and atraumatic.  Eyes: EOM are normal.  Neck: Normal range of motion. Neck supple.  Cardiovascular: Normal rate and regular rhythm.  Respiratory: Effort normal and breath sounds normal. No respiratory distress.  GI: Soft.  No upper abdominal pain  Musculoskeletal: She exhibits edema. She exhibits no tenderness.  Neurological: She is alert and oriented to person, place, and time.  Skin: Skin is warm and dry.  Psychiatric: She has a normal mood and affect.      Assessment/Plan: IUP @ 37 2/7 weeks. Gestational HTN Gestational Diabetes GBS positive.  Resistant to Clindamycin. Morbid Obesity PCN allergy.  IOL with Cytotec.  Low dose Pitocin with Foley bulb  if contracting frequently. Repeat Preeclampsia labs Vancomycin for GBS prophylaxis.   Geryl Rankinsvelyn Fredonia Casalino 04/26/2017, 1:18 PM

## 2017-04-26 NOTE — Progress Notes (Signed)
Margaret Little is a 32 y.o. G1P0000 at 9463w2d   Subjective: Pt with some discomfort.  Declined Stadol.  Denies headache or visual changes. BP normal when upright.  BP elevated when she got back in bed.  Objective: BP (!) 151/94   Pulse 76   Temp 98.5 F (36.9 C) (Oral)   Resp 18   Ht 5\' 7"  (1.702 m)   Wt 132.9 kg (293 lb 1 oz)   LMP  (LMP Unknown) Comment: "10 weeks" as of today  BMI 45.90 kg/m  No intake/output data recorded. No intake/output data recorded.  FHT:  Reactive, good variability. UC:   irregular, every 2-5 minutes SVE:   Dilation: 1 Effacement (%): 50 Station: -3 Exam by:: Dr. Dion BodyVarnado Bulb still in cervix.  Close to expulsion.  Labs: Lab Results  Component Value Date   WBC 13.8 (H) 04/26/2017   HGB 12.3 04/26/2017   HCT 37.1 04/26/2017   MCV 81.9 04/26/2017   PLT 235 04/26/2017    Assessment / Plan: IUP @ 37 2/[redacted] weeks Gestational HTN-Ruled out Preeclampsia. Gestational Diabetes GBS positive. Resistant to Clindamycin. S/p Vancomycin x 1 dose. Morbid Obesity PCN allergy.    Labor: Foley bulb still in place. On low dose Pitocin. Preeclampsia:  BP elevated but does not meet criteria for IV antihypertensives. Fetal Wellbeing:  Category I Pain Control:  Labor support without medications I/D:  GBS positive.  S/p Vancomycin x 1. Anticipated MOD:  NSVD  Margaret Little 04/26/2017, 11:01 PM

## 2017-04-26 NOTE — Anesthesia Pain Management Evaluation Note (Deleted)
  CRNA Pain Management Visit Note  Patient: Margaret Little, 32 y.o., female  "Hello I am a member of the anesthesia team at [facility]. We have an anesthesia team available at all times to provide care throughout the hospital, including epidural management and anesthesia for C-section. I don't know your plan for the delivery whether it a natural birth, water birth, IV sedation, nitrous supplementation, doula or epidural, but we want to meet your pain goals."   1.Was your pain managed to your expectations on prior hospitalizations?   Yes   2.What is your expectation for pain management during this hospitalization?     IV pain meds  3.How can we help you reach that goal? IV medications and Nursing support  Record the patient's initial score and the patient's pain goal.   Pain: 8/10  Pain Goal: 0/10 [facility] wants you to be able to say your pain was always managed very well.  Margaret ArntSterling, Ikhlas Albo Marie 04/26/2017

## 2017-04-27 ENCOUNTER — Inpatient Hospital Stay (HOSPITAL_COMMUNITY): Payer: 59 | Admitting: Anesthesiology

## 2017-04-27 ENCOUNTER — Encounter (HOSPITAL_COMMUNITY): Payer: Self-pay | Admitting: Anesthesiology

## 2017-04-27 LAB — CBC
HCT: 35.8 % — ABNORMAL LOW (ref 36.0–46.0)
Hemoglobin: 11.8 g/dL — ABNORMAL LOW (ref 12.0–15.0)
MCH: 27.1 pg (ref 26.0–34.0)
MCHC: 33 g/dL (ref 30.0–36.0)
MCV: 82.1 fL (ref 78.0–100.0)
Platelets: 206 10*3/uL (ref 150–400)
RBC: 4.36 MIL/uL (ref 3.87–5.11)
RDW: 14.6 % (ref 11.5–15.5)
WBC: 15.2 10*3/uL — ABNORMAL HIGH (ref 4.0–10.5)

## 2017-04-27 LAB — GLUCOSE, CAPILLARY
Glucose-Capillary: 97 mg/dL (ref 65–99)
Glucose-Capillary: 67 mg/dL (ref 65–99)
Glucose-Capillary: 78 mg/dL (ref 65–99)
Glucose-Capillary: 72 mg/dL (ref 65–99)

## 2017-04-27 LAB — RPR: RPR Ser Ql: NONREACTIVE

## 2017-04-27 MED ORDER — FENTANYL CITRATE (PF) 100 MCG/2ML IJ SOLN
INTRAMUSCULAR | Status: AC
  Filled 2017-04-27: qty 2

## 2017-04-27 MED ORDER — OXYTOCIN 10 UNIT/ML IJ SOLN
INTRAMUSCULAR | Status: AC
Start: 2017-04-27 — End: ?
  Filled 2017-04-27: qty 4

## 2017-04-27 MED ORDER — PHENYLEPHRINE 40 MCG/ML (10ML) SYRINGE FOR IV PUSH (FOR BLOOD PRESSURE SUPPORT)
80.0000 ug | PREFILLED_SYRINGE | INTRAVENOUS | Status: DC | PRN
  Filled 2017-04-27: qty 5
  Filled 2017-04-27: qty 10

## 2017-04-27 MED ORDER — LIDOCAINE HCL (PF) 1 % IJ SOLN
INTRAMUSCULAR | Status: DC | PRN
  Administered 2017-04-27: 6 mL via EPIDURAL
  Administered 2017-04-27: 7 mL via EPIDURAL

## 2017-04-27 MED ORDER — FENTANYL 2.5 MCG/ML BUPIVACAINE 1/10 % EPIDURAL INFUSION (WH - ANES)
14.0000 mL/h | INTRAMUSCULAR | Status: DC | PRN
  Administered 2017-04-27 (×2): 14 mL/h via EPIDURAL
  Filled 2017-04-27 (×2): qty 100

## 2017-04-27 MED ORDER — PHENYLEPHRINE 40 MCG/ML (10ML) SYRINGE FOR IV PUSH (FOR BLOOD PRESSURE SUPPORT)
80.0000 ug | PREFILLED_SYRINGE | INTRAVENOUS | Status: DC | PRN
  Filled 2017-04-27: qty 5

## 2017-04-27 MED ORDER — CEFAZOLIN SODIUM-DEXTROSE 2-4 GM/100ML-% IV SOLN
INTRAVENOUS | Status: AC
  Filled 2017-04-27: qty 100

## 2017-04-27 MED ORDER — LACTATED RINGERS IV SOLN: 500.0000 mL | Freq: Once | INTRAVENOUS | Status: DC

## 2017-04-27 MED ORDER — MIDAZOLAM HCL 2 MG/2ML IJ SOLN
INTRAMUSCULAR | Status: AC
  Filled 2017-04-27: qty 2

## 2017-04-27 MED ORDER — DIPHENHYDRAMINE HCL 50 MG/ML IJ SOLN: 12.5000 mg | INTRAMUSCULAR | Status: DC | PRN

## 2017-04-27 MED ORDER — SCOPOLAMINE 1 MG/3DAYS TD PT72
MEDICATED_PATCH | TRANSDERMAL | Status: AC
  Filled 2017-04-27: qty 1

## 2017-04-27 MED ORDER — EPHEDRINE 5 MG/ML INJ
10.0000 mg | INTRAVENOUS | Status: DC | PRN
  Filled 2017-04-27: qty 2

## 2017-04-27 MED ORDER — ONDANSETRON HCL 4 MG/2ML IJ SOLN
INTRAMUSCULAR | Status: AC
  Filled 2017-04-27: qty 2

## 2017-04-27 MED ORDER — OXYTOCIN 40 UNITS IN LACTATED RINGERS INFUSION - SIMPLE MED: 1.0000 m[IU]/min | INTRAVENOUS | Status: DC

## 2017-04-27 MED ORDER — MORPHINE SULFATE (PF) 0.5 MG/ML IJ SOLN
INTRAMUSCULAR | Status: AC
  Filled 2017-04-27: qty 10

## 2017-04-27 MED ORDER — DEXTROSE 5 % IV SOLN
200.0000 mg | Freq: Three times a day (TID) | INTRAVENOUS | Status: DC
  Administered 2017-04-27 – 2017-04-29 (×5): 200 mg via INTRAVENOUS
  Filled 2017-04-27 (×6): qty 5

## 2017-04-27 NOTE — Progress Notes (Signed)
Pt starting to feel more pain with contractions but it is not severe.  18 units of Pitocin per RN. 135/98 Gen: NAD, A&O x 3 Abd:  Nontender Cervix 5-6/50/-2 AROM clear, copious fluid IUPC and FSE placed due to inadequate tracing. Cat I tracing.  Contractions not tracing well, q 3 min?  CBG (last 3)  Recent Labs    04/27/17 0846 04/27/17 1336  GLUCAP 97 67    A/P IUP @ 37 3/[redacted] weeks Gestational HTN-No s/sxs of Preeclampsia.  BPs mildly elevated. Gestational diabetes.  Blood sugar normal. Morbid Obesity. GBS+ on Vancomycin.  Continue to increase Pitocin until adequate contractions. IV pain medication, Nitrous Oxide or epidural have been ordered. Dr. Richardson Doppole covering after 5 pm.

## 2017-04-27 NOTE — Anesthesia Preprocedure Evaluation (Signed)
Anesthesia Evaluation  Patient identified by MRN, date of birth, ID band Patient awake    Reviewed: Allergy & Precautions, H&P , NPO status , Patient's Chart, lab work & pertinent test results  Airway Mallampati: III  TM Distance: >3 FB Neck ROM: full    Dental no notable dental hx. (+) Teeth Intact   Pulmonary neg pulmonary ROS,    Pulmonary exam normal breath sounds clear to auscultation       Cardiovascular hypertension, Normal cardiovascular exam Rhythm:regular Rate:Normal     Neuro/Psych negative psych ROS   GI/Hepatic negative GI ROS, Neg liver ROS,   Endo/Other  diabetes, Gestational, Oral Hypoglycemic AgentsMorbid obesity  Renal/GU negative Renal ROS     Musculoskeletal negative musculoskeletal ROS (+)   Abdominal (+) + obese,   Peds  Hematology negative hematology ROS (+)   Anesthesia Other Findings   Reproductive/Obstetrics (+) Pregnancy                             Anesthesia Physical Anesthesia Plan  ASA: III  Anesthesia Plan: Epidural   Post-op Pain Management:    Induction:   PONV Risk Score and Plan:   Airway Management Planned:   Additional Equipment:   Intra-op Plan:   Post-operative Plan:   Informed Consent: I have reviewed the patients History and Physical, chart, labs and discussed the procedure including the risks, benefits and alternatives for the proposed anesthesia with the patient or authorized representative who has indicated his/her understanding and acceptance.     Plan Discussed with:   Anesthesia Plan Comments:         Anesthesia Quick Evaluation

## 2017-04-27 NOTE — Progress Notes (Signed)
Prenatal counseling for impending newborn done--1st child, currently 37wks, gest DM, prenatal at 244-6wks Z76.81

## 2017-04-27 NOTE — Progress Notes (Signed)
Pharmacy Antibiotic Note  Shella MaximMorgan C Brown is a 32 y.o. female G1 @37  2/7 weeks admitted on 04/26/2017 for IOL due to gestational HTN. Pt is progressing in labor but now has an elevated temperature. Pharmacy has been consulted for Gentamicin dosing for chorioamnionitis.  Plan: Gentamicin 200 mg IV every 8 hours Monitor serum creatinine per protocol Serum gentamicin levels as indicated.  Height: 5\' 7"  (170.2 cm) Weight: 293 lb 1 oz (132.9 kg) IBW/kg (Calculated) : 61.6  Temp (24hrs), Avg:98.5 F (36.9 C), Min:97.6 F (36.4 C), Max:100.2 F (37.9 C)  Recent Labs  Lab 04/25/17 1927 04/26/17 1400 04/27/17 1442  WBC 13.7* 13.8* 15.2*  CREATININE 0.72 0.75  --     Estimated Creatinine Clearance: 146.3 mL/min (by C-G formula based on SCr of 0.75 mg/dL).    Allergies  Allergen Reactions  . Amoxicillin Hives  . Penicillins Hives    Has patient had a PCN reaction causing immediate rash, facial/tongue/throat swelling, SOB or lightheadedness with hypotension: No Has patient had a PCN reaction causing severe rash involving mucus membranes or skin necrosis: No Has patient had a PCN reaction that required hospitalization: No Has patient had a PCN reaction occurring within the last 10 years: No If all of the above answers are "NO", then may proceed with Cephalosporin use.    Antimicrobials this admission: Vancomycin for GBS prophylaxis 3/6>>  Dose adjustments this admission: N/A  Microbiology results:   Thank you for allowing pharmacy to be a part of this patient's care.  Arelia SneddonMason, Akanksha Bellmore Anne 04/27/2017 11:25 PM

## 2017-04-27 NOTE — Progress Notes (Signed)
Margaret Little is a 32 y.o. G1P0000 at 6334w3d  admitted for induction of labor due to Hypertension.  Subjective: Patient is comfortable with her epidural. Feels pressure at times. + FM   Objective: BP (!) 113/56   Pulse 74   Temp 98.3 F (36.8 C) (Oral)   Resp 18   Ht 5\' 7"  (1.702 m)   Wt 132.9 kg (293 lb 1 oz)   LMP  (LMP Unknown) Comment: "10 weeks" as of today  SpO2 100%   BMI 45.90 kg/m  No intake/output data recorded. Total I/O In: -  Out: 700 [Urine:700]  FHT:  FHR: 130 bpm, variability: moderate,  accelerations:  Present,  decelerations:  Absent UC:   regular, every 2 minutes on 24 mU of pitocin  SVE:   Dilation: 8 Effacement (%): 90 Station: 0 Exam by:: stone rnc  Labs: Lab Results  Component Value Date   WBC 15.2 (H) 04/27/2017   HGB 11.8 (L) 04/27/2017   HCT 35.8 (L) 04/27/2017   MCV 82.1 04/27/2017   PLT 206 04/27/2017    Assessment / Plan: Induction of labor due to gestational hypertension,  progressing well on pitocin  Labor: Progressing normally Preeclampsia:  labs stable Fetal Wellbeing:  Category I Pain Control:  Epidural I/D:  vancomycin  Anticipated MOD:  NSVD  Lori Clemmons CNM and Dr. Su Hiltoberts with [facility] covering after 7pm  Mathew Storck J. 04/27/2017, 6:30 PM

## 2017-04-27 NOTE — Progress Notes (Signed)
Margaret Little is a 32 y.o. G1P0000 at 3590w3d   Subjective: Pt comfortable.  Foley bulb expulsion at ~ 0400.  Objective: BP 133/90   Pulse 84   Temp 97.8 F (36.6 C) (Oral)   Resp 17   Ht 5\' 7"  (1.702 m)   Wt 132.9 kg (293 lb 1 oz)   LMP  (LMP Unknown) Comment: "10 weeks" as of today  BMI 45.90 kg/m  No intake/output data recorded. No intake/output data recorded.  FHT:  FHR: 140s bpm, variability: moderate,  accelerations:  Present,  decelerations:  Absent UC:   regular, every 2-3 minutes SVE:   Dilation: 4.5 Effacement (%): 60 Station: -3 Exam by:: [employer]he[employer]-BuschSavannah brendle RN  Ballotable.  Labs: Lab Results  Component Value Date   WBC 13.8 (H) 04/26/2017   HGB 12.3 04/26/2017   HCT 37.1 04/26/2017   MCV 81.9 04/26/2017   PLT 235 04/26/2017    Assessment / Plan: IUP @ 37 3/7 weeks  Labor: Progressing on Pitocin, will continue to increase then AROM Preeclampsia:  No s/sxs of Preeclampsia.  No Hydralazine has been given. Fetal Wellbeing:  Category I Pain Control:  Labor support without medications I/D:  GBS+ on Vancomycin. Anticipated MOD:  NSVD  Margaret Little 04/27/2017, 8:23 AM

## 2017-04-27 NOTE — Progress Notes (Signed)
Pt sleeping on her right side BP 179/131.  RN to repeat Baby off monitor currently but has been reassuring. RN in room to adjust. Increase Pitocin when foley bulb expelled.

## 2017-04-27 NOTE — Anesthesia Procedure Notes (Addendum)
Epidural Patient location during procedure: OB Start time: 04/27/2017 3:53 PM End time: 04/27/2017 3:57 PM  Staffing Anesthesiologist: Leilani AbleHatchett, Kursten Kruk, MD Performed: anesthesiologist   Preanesthetic Checklist Completed: patient identified, site marked, surgical consent, pre-op evaluation, timeout performed, IV checked, risks and benefits discussed and monitors and equipment checked  Epidural Patient position: sitting Prep: site prepped and draped and DuraPrep Patient monitoring: continuous pulse ox and blood pressure Approach: midline Location: L3-L4 Injection technique: LOR air  Needle:  Needle type: Tuohy  Needle gauge: 17 G Needle length: 9 cm and 9 Needle insertion depth: 6 cm Catheter type: closed end flexible Catheter size: 19 Gauge Catheter at skin depth: 11 cm Test dose: negative and Other  Assessment Sensory level: T9 Events: blood not aspirated, injection not painful, no injection resistance, negative IV test and no paresthesia

## 2017-04-27 NOTE — Progress Notes (Signed)
S:. Pt feeling pressure with some contractions but not all. Temperature decreased after IVF bolus and tylenol. Pitocin on 10818mu/min.  O: BP 126/66   Pulse 78   Temp 99.7 F (37.6 C) (Axillary)   Resp 16   Ht 5\' 7"  (1.702 m)   Wt 293 lb 1 oz (132.9 kg)   LMP  (LMP Unknown) Comment: "10 weeks" as of today  SpO2 100%   BMI 45.90 kg/m   FHT's Cat 1 uc's inadequate 120 MVU A: Active labor     Chorioamnionitis P: Start Gentamycin per pharmacy protocol      Limit vag exams      Titrate pitocin to 30mu       Recheck cervix after 2 hours of adequate contractions  The P[employer] FinancialLori Christpoher Little CNM

## 2017-04-27 NOTE — Progress Notes (Signed)
Margaret Little is a 32 y.o. female patient. No diagnosis found. Past Medical History:  Diagnosis Date  . Gestational diabetes   . Headache    migraines; takes Tylenol  . Hypertension   . Vaginal Pap smear, abnormal    repeat was normal   OB History    Gravida Para Term Preterm AB Living   1 0 0 0 0 0   SAB TAB Ectopic Multiple Live Births   0 0 0 0 0     6230w3d Estimated Date of Delivery: 05/15/17 Allergies  Allergen Reactions  . Amoxicillin Hives  . Penicillins Hives    Has patient had a PCN reaction causing immediate rash, facial/tongue/throat swelling, SOB or lightheadedness with hypotension: No Has patient had a PCN reaction causing severe rash involving mucus membranes or skin necrosis: No Has patient had a PCN reaction that required hospitalization: No Has patient had a PCN reaction occurring within the last 10 years: No If all of the above answers are "NO", then may proceed with Cephalosporin use.   Active Problems:   Gestational hypertension w/o significant proteinuria in 3rd trimester  Blood pressure 126/66, pulse 78, temperature 99.7 F (37.6 C), temperature source Axillary, resp. rate 16, height 5\' 7"  (1.702 m), weight 293 lb 1 oz (132.9 kg), SpO2 100 %.  History Maternal Exam:  Uterine Assessment: Contraction strength is mild.  Contraction frequency is irregular.   Abdomen: Patient reports no abdominal tenderness. Fetal presentation: vertex  Introitus: Normal vulva. Normal vagina.  Cervix: Cervix evaluated by digital exam.     Fetal Exam Fetal Monitor Review: Mode: ultrasound and fetal scalp electrode.   Pattern: accelerations present and no decelerations.    Fetal State Assessment: Category I - tracings are normal.    VE 8/80/-2 Assessment: Active phase labor.  Membrane status: AROM.  Fetal well-being: normal.  Bleeding source: bloody show.   Plan: IUPC replaced Pitocin restarted at 6712mu/min for adequate pattern   Elmore GuiseLori A Kristalynn Coddington  CNM 04/27/2017

## 2017-04-28 ENCOUNTER — Encounter (HOSPITAL_COMMUNITY): Payer: Self-pay | Admitting: *Deleted

## 2017-04-28 LAB — CBC
HCT: 31.3 % — ABNORMAL LOW (ref 36.0–46.0)
Hemoglobin: 10.4 g/dL — ABNORMAL LOW (ref 12.0–15.0)
MCH: 27.2 pg (ref 26.0–34.0)
MCHC: 33.2 g/dL (ref 30.0–36.0)
MCV: 81.9 fL (ref 78.0–100.0)
Platelets: 206 10*3/uL (ref 150–400)
RBC: 3.82 MIL/uL — ABNORMAL LOW (ref 3.87–5.11)
RDW: 14.9 % (ref 11.5–15.5)
WBC: 18.2 10*3/uL — ABNORMAL HIGH (ref 4.0–10.5)

## 2017-04-28 LAB — GLUCOSE, CAPILLARY
Glucose-Capillary: 102 mg/dL — ABNORMAL HIGH (ref 65–99)
Glucose-Capillary: 114 mg/dL — ABNORMAL HIGH (ref 65–99)
Glucose-Capillary: 75 mg/dL (ref 65–99)
Glucose-Capillary: 86 mg/dL (ref 65–99)
Glucose-Capillary: 92 mg/dL (ref 65–99)

## 2017-04-28 MED ORDER — DIBUCAINE 1 % RE OINT: 1.0000 "application " | TOPICAL_OINTMENT | RECTAL | Status: DC | PRN

## 2017-04-28 MED ORDER — COCONUT OIL OIL: 1.0000 "application " | TOPICAL_OIL | Status: DC | PRN

## 2017-04-28 MED ORDER — BENZOCAINE-MENTHOL 20-0.5 % EX AERO: 1.0000 "application " | INHALATION_SPRAY | CUTANEOUS | Status: DC | PRN

## 2017-04-28 MED ORDER — SENNOSIDES-DOCUSATE SODIUM 8.6-50 MG PO TABS
2.0000 | ORAL_TABLET | ORAL | Status: DC
  Administered 2017-04-28 – 2017-04-29 (×2): 2 via ORAL
  Filled 2017-04-28 (×2): qty 2

## 2017-04-28 MED ORDER — DIPHENHYDRAMINE HCL 25 MG PO CAPS: 25.0000 mg | ORAL_CAPSULE | Freq: Four times a day (QID) | ORAL | Status: DC | PRN

## 2017-04-28 MED ORDER — ZOLPIDEM TARTRATE 5 MG PO TABS: 5.0000 mg | ORAL_TABLET | Freq: Every evening | ORAL | Status: DC | PRN

## 2017-04-28 MED ORDER — ONDANSETRON HCL 4 MG PO TABS: 4.0000 mg | ORAL_TABLET | ORAL | Status: DC | PRN

## 2017-04-28 MED ORDER — ACETAMINOPHEN 325 MG PO TABS: 650.0000 mg | ORAL_TABLET | ORAL | Status: DC | PRN

## 2017-04-28 MED ORDER — WITCH HAZEL-GLYCERIN EX PADS: 1.0000 "application " | MEDICATED_PAD | CUTANEOUS | Status: DC | PRN

## 2017-04-28 MED ORDER — SIMETHICONE 80 MG PO CHEW: 80.0000 mg | CHEWABLE_TABLET | ORAL | Status: DC | PRN

## 2017-04-28 MED ORDER — ONDANSETRON HCL 4 MG/2ML IJ SOLN: 4.0000 mg | INTRAMUSCULAR | Status: DC | PRN

## 2017-04-28 MED ORDER — PRENATAL MULTIVITAMIN CH
1.0000 | ORAL_TABLET | Freq: Every day | ORAL | Status: DC
  Administered 2017-04-28 – 2017-04-30 (×3): 1 via ORAL
  Filled 2017-04-28 (×3): qty 1

## 2017-04-28 MED ORDER — TETANUS-DIPHTH-ACELL PERTUSSIS 5-2.5-18.5 LF-MCG/0.5 IM SUSP: 0.5000 mL | Freq: Once | INTRAMUSCULAR | Status: DC

## 2017-04-28 MED ORDER — IBUPROFEN 600 MG PO TABS
600.0000 mg | ORAL_TABLET | Freq: Four times a day (QID) | ORAL | Status: DC
  Administered 2017-04-28 – 2017-04-30 (×7): 600 mg via ORAL
  Filled 2017-04-28 (×9): qty 1

## 2017-04-28 MED ORDER — VANCOMYCIN HCL IN DEXTROSE 1-5 GM/200ML-% IV SOLN
1000.0000 mg | Freq: Three times a day (TID) | INTRAVENOUS | Status: DC
  Administered 2017-04-28 – 2017-04-29 (×4): 1000 mg via INTRAVENOUS
  Filled 2017-04-28 (×5): qty 200

## 2017-04-28 NOTE — Lactation Note (Addendum)
This note was copied from a baby's chart. Lactation Consultation Note  Patient Name: Margaret Little RUEAV'WToday's Date: 04/28/2017 Reason for consult: Initial assessment   P1, Baby 16 hours old. 6138w4d Reviewed hand expression and spoon fed baby 3 ml. Baby spit up shortly after spoon feeding. Baby very sleepy.  Attempted latching in football hold. Reminded mother to support her breast. Baby too sleepy to latch.  Encouraged STS. Mom encouraged to feed baby 8-12 times/24 hours and with feeding cues.  Spoon feed until he is latching.  Reviewed waking techniques. Mom made aware of O/P services, breastfeeding support groups, community resources, and our phone # for post-discharge questions.     Maternal Data Has patient been taught Hand Expression?: Yes Does the patient have breastfeeding experience prior to this delivery?: No  Feeding Feeding Type: Breast Fed Length of feed: 4 min  LATCH Score                   Interventions Interventions: Breast feeding basics reviewed;Hand express  Lactation Tools Discussed/Used     Consult Status Consult Status: Follow-up Date: 04/29/17 Follow-up type: In-patient    Dahlia ByesBerkelhammer, Sarit Sparano [facility] 04/28/2017, 7:16 PM

## 2017-04-28 NOTE — Progress Notes (Signed)
Pt did not inform nurse before eating lunch.  CBG was missed. Reminded patient to call before next meal so sugar can be checked.

## 2017-04-28 NOTE — Progress Notes (Signed)
S: Feeling contractions to push; otherwise comfortable. Pitocin on 26mu O: BP (!) 146/83   Pulse 70   Temp 99.2 F (37.3 C) (Axillary)   Resp 18   Ht 5\' 7"  (1.702 m)   Wt 293 lb 1 oz (132.9 kg)   LMP  (LMP Unknown) Comment: "10 weeks" as of today  SpO2 100%   BMI 45.90 kg/m  FHT's- Cat 1 Uc's q 2-4 minutes A: 2nd stage     Pushing x 50 minutes P: Continue Pushing      Anticipate Vaginal Delivery Illene BolusLori Clemmons CNM

## 2017-04-28 NOTE — Anesthesia Postprocedure Evaluation (Signed)
Anesthesia Post Note  Patient: Shella MaximMorgan C Brown  Procedure(s) Performed: AN AD HOC LABOR EPIDURAL     Patient location during evaluation: Mother Baby Anesthesia Type: Epidural Level of consciousness: awake and alert Pain management: satisfactory to patient Vital Signs Assessment: post-procedure vital signs reviewed and stable Respiratory status: spontaneous breathing Cardiovascular status: blood pressure returned to baseline and stable Postop Assessment: no headache, no backache, epidural receding, patient able to bend at knees, no apparent nausea or vomiting and adequate PO intake Anesthetic complications: no    Last Vitals:  Vitals:   04/28/17 0605 04/28/17 0838  BP: 115/70 139/82  Pulse: 72 69  Resp: 18 17  Temp: 36.9 C 36.6 C  SpO2:  99%    Last Pain:  Vitals:   04/28/17 0916  TempSrc:   PainSc: 0-No pain   Pain Goal: Patients Stated Pain Goal: 8 (04/26/17 1343)               Cleda ClarksBrowder, Lezly Rumpf R

## 2017-04-28 NOTE — Progress Notes (Signed)
MEDICATION RELATED CONSULT NOTE - FOLLOW UP   Pharmacy Consult for vancomycin and gentamicin Indication: Postpartum endometritis  Allergies  Allergen Reactions  . Amoxicillin Hives  . Penicillins Hives    Has patient had a PCN reaction causing immediate rash, facial/tongue/throat swelling, SOB or lightheadedness with hypotension: No Has patient had a PCN reaction causing severe rash involving mucus membranes or skin necrosis: No Has patient had a PCN reaction that required hospitalization: No Has patient had a PCN reaction occurring within the last 10 years: No If all of the above answers are "NO", then may proceed with Cephalosporin use.    Patient Measurements: Height: 5\' 7"  (170.2 cm) Weight: 293 lb 1 oz (132.9 kg) IBW/kg (Calculated) : 61.6  Vital Signs: Temp: 97.9 F (36.6 C) (03/07 0455) Temp Source: Oral (03/07 0455) BP: 132/74 (03/07 0455) Pulse Rate: 72 (03/07 0455) Intake/Output from previous day: 03/06 0701 - 03/07 0700 In: -  Out: 1400 [Urine:1300; Blood:100] Intake/Output from this shift: Total I/O In: -  Out: 700 [Urine:600; Blood:100]  Labs: Recent Labs    04/25/17 1810 04/25/17 1927 04/26/17 1400 04/26/17 1633 04/27/17 1442  WBC  --  13.7* 13.8*  --  15.2*  HGB  --  12.3 12.3  --  11.8*  HCT  --  36.3 37.1  --  35.8*  PLT  --  227 235  --  206  CREATININE  --  0.72 0.75  --   --   LABCREA 78.00  --   --  147.00  --   ALBUMIN  --  2.5* 2.5*  --   --   PROT  --  6.5 6.2*  --   --   AST  --  16 16  --   --   ALT  --  12* 11*  --   --   ALKPHOS  --  127* 133*  --   --   BILITOT  --  0.4 0.4  --   --    Estimated Creatinine Clearance: 146.3 mL/min (by C-G formula based on SCr of 0.75 mg/dL).   Microbiology: Recent Results (from the past 720 hour(s))  OB RESULT CONSOLE Group B Strep     Status: None   Collection Time: 04/26/17 12:00 AM  Result Value Ref Range Status   GBS Positive  Final    Medications:  Gentamicin 3/6>> Vancomycin   3/6>>  Assessment: 32 yo G1P1 admitted 3/5 for IOL. Pt was treated with gentamicin for suspected chorioamnionitis and also was treated with vancomycin for positive GBS status due to her allergy to penicillin. Pt is now postpartum and antibiotics are to be continued.  Goal of Therapy:  Vancomycin troughs 15-20 mcg/ml Gentamicin serum levels 6-8 mcg/ml with troughs <1 mc/ml.  Plan:  Increase vancomycin dose to 1000 mg IV every 8 hours Continue gentamicin as ordered.  Arelia SneddonMason, Redina Zeller Anne 04/28/2017,6:10 AM

## 2017-04-29 NOTE — Progress Notes (Signed)
Postpartum day #1, NSVD  Subjective Pt without complaints.  Lochia normal.  Pain controlled.  Pt denies headache or visual changes.  Temp:  [97.7 F (36.5 C)-97.8 F (36.6 C)] 97.7 F (36.5 C) (03/08 0515) Pulse Rate:  [60-81] 60 (03/08 0515) Resp:  [18] 18 (03/08 0515) BP: (134-141)/(80-85) 137/84 (03/08 0515) SpO2:  [98 %] 98 % (03/08 0515)  Gen:  NAD, A&O x 3 Uterine fundus:  Firm, nontender Lochia normal Ext:  2-3+ Edema, no calf tenderness bilaterally  CBC    Component Value Date/Time   WBC 18.2 (H) 04/28/2017 0624   RBC 3.82 (L) 04/28/2017 0624   HGB 10.4 (L) 04/28/2017 0624   HCT 31.3 (L) 04/28/2017 0624   PLT 206 04/28/2017 0624   MCV 81.9 04/28/2017 0624   MCH 27.2 04/28/2017 0624   MCHC 33.2 04/28/2017 0624   RDW 14.9 04/28/2017 0624     A/P: S/p SVD doing well. GHTN-BP normal to mildly elevated.  Check BP q 4 hours. GDM.  Discontinued surveillance s/p delivery. Chorioamnionitis, PP Endometritis-S/p Gentamycin x 24 hours in addition to Vancomycin antepartum.   -Afebrile Routine postpartum care. Lactation support. Outpatient circumcision Discharge in am.  Margaret Little 04/29/2017, 1:21 PM

## 2017-04-30 MED ORDER — IBUPROFEN 600 MG PO TABS: 600.0000 mg | ORAL_TABLET | Freq: Four times a day (QID) | ORAL | 0 refills | Status: DC

## 2017-04-30 NOTE — Lactation Note (Signed)
This note was copied from a baby's chart. Lactation Consultation Note: Mom has baby latched to breast when I went into room. Reports he has been nursing well with only a little pain with initial latch that then eases off. Parents report no void or stool since 6 pm last evening.Encouraged to offer both breasts at each feeding.Reviewed engorgement prevention and treatment. Has Medela pump for home. Reviewed our phone number, OP appointments and BFSG as resources for support after DC. To call prn Patient Name: Boy Diona FoleyMorgan Brown [id number]'[id number]'s Date: 04/30/2017 Reason for consult: Follow-up assessment   Maternal Data Formula Feeding for Exclusion: No Has patient been taught Hand Expression?: Yes Does the patient have breastfeeding experience prior to this delivery?: No  Feeding Feeding Type: Breast Fed Length of feed: 25 min  LATCH Score Latch: Grasps breast easily, tongue down, lips flanged, rhythmical sucking.  Audible Swallowing: A few with stimulation  Type of Nipple: Everted at rest and after stimulation  Comfort (Breast/Nipple): Soft / non-tender  Hold (Positioning): No assistance needed to correctly position infant at breast.  LATCH Score: 9  Interventions    Lactation Tools Discussed/Used WIC Program: No   Consult Status Consult Status: Complete    Pamelia HoitWeeks, Colbert Curenton D 04/30/2017, 9:15 AM

## 2017-04-30 NOTE — Discharge Summary (Signed)
OB Discharge Summary     Patient Name: Margaret Little DOB: Jul 31, 1985 MRN: [id number]  Date of admission: 04/26/2017 Delivering MD: Illene Bolus A   Date of discharge: 04/30/2017  Admitting diagnosis: INDUCTION Intrauterine pregnancy: [redacted]w[redacted]d     Secondary diagnosis:  Active Problems:   Gestational hypertension w/o significant proteinuria in 3rd trimester  Additional problems: None     Discharge diagnosis: Term Pregnancy Delivered                                                                                                Post partum procedures:rhogam  Augmentation: AROM and Pitocin  Complications: Chorioamnionitis    Hospital course:  Induction of Labor With Vaginal Delivery   32 y.o. yo G1P1001 at [redacted]w[redacted]d was admitted to the hospital 04/26/2017 for induction of labor.  Indication for induction: Gestational hypertension and A2 DM.  Patient had an uncomplicated labor course as follows: Membrane Rupture Time/Date: 1:27 PM ,04/27/2017   Intrapartum Procedures: Episiotomy: None [1]                                         Lacerations:  1st degree [2]  Patient had delivery of a Viable infant.  Information for the patient's newborn:  Margaret, Little [[id number]]  Delivery Method: Vag-Spont   04/28/2017  Details of delivery can be found in separate delivery note.  Patient had a routine postpartum course. Patient is discharged home 04/30/17.  Physical exam  Vitals:   04/29/17 1400 04/29/17 1739 04/29/17 2130 04/30/17 0200  BP: 119/77 136/79 113/80 125/74  Pulse: 71 85 82 79  Resp:  18 18   Temp:  98 F (36.7 C) 98.4 F (36.9 C)   TempSrc:  Oral Oral   SpO2:      Weight:      Height:       General: alert, cooperative and no distress Lochia: appropriate Uterine Fundus: firm Incision: N/A DVT Evaluation: No evidence of DVT seen on physical exam. Negative Homan's sign. Labs: Lab Results  Component Value Date   WBC 18.2 (H) 04/28/2017   HGB 10.4 (L) 04/28/2017   HCT 31.3  (L) 04/28/2017   MCV 81.9 04/28/2017   PLT 206 04/28/2017   CMP Latest Ref Rng & Units 04/26/2017  Glucose 65 - 99 mg/dL 086(V)  BUN 6 - 20 mg/dL 8  Creatinine 7.84 - 6.96 mg/dL 2.95  Sodium 284 - 132 mmol/L 136  Potassium 3.5 - 5.1 mmol/L 4.2  Chloride 101 - 111 mmol/L 107  CO2 22 - 32 mmol/L 21(L)  Calcium 8.9 - 10.3 mg/dL 4.4(W)  Total Protein 6.5 - 8.1 g/dL 6.2(L)  Total Bilirubin 0.3 - 1.2 mg/dL 0.4  Alkaline Phos 38 - 126 U/L 133(H)  AST 15 - 41 U/L 16  ALT 14 - 54 U/L 11(L)    Discharge instruction: per After Visit Summary and "Baby and Me Booklet".  After visit meds:    Diet: routine diet  Activity: Advance as tolerated. Pelvic  rest for 6 weeks.   Outpatient follow up:1 week Follow up Appt:No future appointments. Follow up Visit:No Follow-up on file.  Postpartum contraception: Progesterone only pills  Newborn Data: Live born female  Birth Weight: 7 lb 1.4 oz (3215 g) APGAR: 8, 9  Newborn Delivery   Birth date/time:  04/28/2017 03:10:00 Delivery type:  Vaginal, Spontaneous     Baby Feeding: Breast Disposition:home with mother   04/30/2017 Kenney HousemanNancy Jean Little, CNM

## 2017-05-02 MED FILL — IBUPROFEN 600 MG TABLET: 600 | 8 days supply | Qty: 30 | Fill #0

## 2017-06-06 MED FILL — NORETHINDRONE 0.35 MG TAB: 0.35 | 84 days supply | Qty: 84 | Fill #0

## 2017-08-15 MED FILL — NORETHINDRONE 0.35 MG TAB: 0.35 | 84 days supply | Qty: 84 | Fill #1

## 2017-11-23 MED FILL — NORETHINDRONE 0.35 MG TAB: 0.35 | 84 days supply | Qty: 84 | Fill #2

## 2018-01-11 DIAGNOSIS — J029 Acute pharyngitis, unspecified: Secondary | ICD-10-CM | POA: Diagnosis not present

## 2018-01-11 DIAGNOSIS — R05 Cough: Secondary | ICD-10-CM | POA: Diagnosis not present

## 2018-01-26 DIAGNOSIS — J209 Acute bronchitis, unspecified: Secondary | ICD-10-CM | POA: Diagnosis not present

## 2018-01-26 DIAGNOSIS — J019 Acute sinusitis, unspecified: Secondary | ICD-10-CM | POA: Diagnosis not present

## 2018-01-26 MED FILL — PROMETHAZINE W/DM SYRUP: 6.25-15 | 9 days supply | Qty: 180 | Fill #0

## 2018-01-26 MED FILL — AZITHROMYCIN 500 MG TABLET: 500 | 5 days supply | Qty: 5 | Fill #0

## 2018-01-26 MED FILL — predniSONE 20 MG TABS: 20 | 8 days supply | Qty: 12 | Fill #0

## 2018-02-22 NOTE — L&D Delivery Note (Addendum)
Delivery Note  Margaret Little, G2 P1001 @ 39+1, IOL for A2GDM Complete dilation at 0036 Onset of pushing at 0041 FHR second stage Cat II  Analgesia/Anesthesia intrapartum: Epidural  Deep variables w/ pushing. Possible vac assist, risks and benefits discussed by Dr. Mancel Bale. Advised closed-glottis pushing. Delivery of viable female at 1 by Clois Dupes, CNM w/ Dr. Mancel Bale in attendance. Vertex in LOA position, nuchal cord x1, not reduced, delivered via somersault maneuver. Cord clamping delayed x1 min, dried and stim, infant to maternal abd.  Cord double clamped after I min and cut by father  Cord blood sample collected Arterial cord blood sample not collected.  Placenta delivered Duncan side, intact, with 3 VC.  Placenta to L&D Uterine tone firm w/ massage, bleeding appropriate  2nd laceration identified.  Anesthesia: Epidural Repair 3-0 Vicryl QBL (mL): 289   APGAR: 8,9 Mom to postpartum.  Baby to Couplet care / Skin to Skin   Gest DM     -Fasting CBG in AM, evaluate need to continue oral hypoglycemic BMI 45     -Prophylactic Lovenox PP GBS pos     -Adequate treatment w/ Vancomycin CHTN     -normotensive, call for severe range BP   [name] MSN, CNM 11/17/2018, 2:12 AM  Agree with above except signed out to me pt has GHTN and on no meds

## 2018-04-01 DIAGNOSIS — J209 Acute bronchitis, unspecified: Secondary | ICD-10-CM | POA: Diagnosis not present

## 2018-04-01 DIAGNOSIS — J9801 Acute bronchospasm: Secondary | ICD-10-CM | POA: Diagnosis not present

## 2018-04-13 DIAGNOSIS — Z3201 Encounter for pregnancy test, result positive: Secondary | ICD-10-CM | POA: Diagnosis not present

## 2018-04-13 DIAGNOSIS — Z3490 Encounter for supervision of normal pregnancy, unspecified, unspecified trimester: Secondary | ICD-10-CM | POA: Diagnosis not present

## 2018-04-13 DIAGNOSIS — Z3481 Encounter for supervision of other normal pregnancy, first trimester: Secondary | ICD-10-CM | POA: Diagnosis not present

## 2018-04-13 LAB — OB RESULTS CONSOLE GC/CHLAMYDIA
Chlamydia: NEGATIVE
Gonorrhea: NEGATIVE

## 2018-04-13 LAB — OB RESULTS CONSOLE RPR: RPR: NONREACTIVE

## 2018-04-13 LAB — OB RESULTS CONSOLE HIV ANTIBODY (ROUTINE TESTING): HIV: NONREACTIVE

## 2018-04-13 LAB — OB RESULTS CONSOLE ABO/RH: RH Type: POSITIVE

## 2018-04-13 LAB — OB RESULTS CONSOLE ANTIBODY SCREEN: Antibody Screen: NEGATIVE

## 2018-04-13 LAB — OB RESULTS CONSOLE HEPATITIS B SURFACE ANTIGEN: Hepatitis B Surface Ag: NEGATIVE

## 2018-04-13 LAB — OB RESULTS CONSOLE RUBELLA ANTIBODY, IGM: Rubella: IMMUNE

## 2018-05-15 DIAGNOSIS — O26841 Uterine size-date discrepancy, first trimester: Secondary | ICD-10-CM | POA: Diagnosis not present

## 2018-05-15 DIAGNOSIS — O09291 Supervision of pregnancy with other poor reproductive or obstetric history, first trimester: Secondary | ICD-10-CM | POA: Diagnosis not present

## 2018-05-18 DIAGNOSIS — R7309 Other abnormal glucose: Secondary | ICD-10-CM | POA: Diagnosis not present

## 2018-09-08 MED FILL — glyBURIDE 2.5 MG TABS: 2.5 | 30 days supply | Qty: 15 | Fill #0

## 2018-09-15 MED FILL — ACCU-CHEK AVIVA PLUS TEST S: 25 days supply | Qty: 100 | Fill #0

## 2018-10-20 MED FILL — FLUCONAZOLE 150 MG TABS: 150 | 1 days supply | Qty: 1 | Fill #0

## 2018-11-01 MED FILL — FLUCONAZOLE 150 MG TABLET: 150 | 2 days supply | Qty: 2 | Fill #0

## 2018-11-02 ENCOUNTER — Telehealth (HOSPITAL_COMMUNITY): Payer: Self-pay | Admitting: *Deleted

## 2018-11-02 NOTE — Telephone Encounter (Signed)
Preadmission screen  

## 2018-11-03 ENCOUNTER — Encounter (HOSPITAL_COMMUNITY): Payer: Self-pay | Admitting: *Deleted

## 2018-11-06 LAB — OB RESULTS CONSOLE GBS: GBS: POSITIVE

## 2018-11-13 ENCOUNTER — Other Ambulatory Visit: Payer: Self-pay | Admitting: Obstetrics and Gynecology

## 2018-11-14 ENCOUNTER — Other Ambulatory Visit: Payer: Self-pay

## 2018-11-14 ENCOUNTER — Other Ambulatory Visit (HOSPITAL_COMMUNITY)
Admission: RE | Admit: 2018-11-14 | Discharge: 2018-11-14 | Disposition: A | Discharge status: Home / Self Care | Payer: Medicaid Other | Source: Ambulatory Visit | Attending: Obstetrics and Gynecology | Admitting: Obstetrics and Gynecology

## 2018-11-14 DIAGNOSIS — Z20828 Contact with and (suspected) exposure to other viral communicable diseases: Secondary | ICD-10-CM | POA: Diagnosis not present

## 2018-11-14 LAB — SARS CORONAVIRUS 2 BY RT PCR (HOSPITAL ORDER, PERFORMED IN ~~LOC~~ HOSPITAL LAB): SARS Coronavirus 2: NEGATIVE

## 2018-11-14 NOTE — MAU Note (Signed)
Asymptomatic, swab collected. 

## 2018-11-16 ENCOUNTER — Other Ambulatory Visit: Payer: Self-pay

## 2018-11-16 ENCOUNTER — Inpatient Hospital Stay (HOSPITAL_COMMUNITY)
Admission: AD | Admit: 2018-11-16 | Discharge: 2018-11-18 | DRG: 807 | Disposition: A | Discharge status: Home / Self Care | Payer: Medicaid Other | Attending: Obstetrics and Gynecology | Admitting: Obstetrics and Gynecology

## 2018-11-16 ENCOUNTER — Inpatient Hospital Stay (HOSPITAL_COMMUNITY): Payer: Medicaid Other | Admitting: Anesthesiology

## 2018-11-16 ENCOUNTER — Encounter (HOSPITAL_COMMUNITY): Payer: Self-pay

## 2018-11-16 ENCOUNTER — Inpatient Hospital Stay (HOSPITAL_COMMUNITY): Payer: Medicaid Other

## 2018-11-16 DIAGNOSIS — O134 Gestational [pregnancy-induced] hypertension without significant proteinuria, complicating childbirth: Secondary | ICD-10-CM | POA: Diagnosis present

## 2018-11-16 DIAGNOSIS — O24415 Gestational diabetes mellitus in pregnancy, controlled by oral hypoglycemic drugs: Secondary | ICD-10-CM | POA: Diagnosis present

## 2018-11-16 DIAGNOSIS — O99824 Streptococcus B carrier state complicating childbirth: Secondary | ICD-10-CM | POA: Diagnosis present

## 2018-11-16 DIAGNOSIS — O24425 Gestational diabetes mellitus in childbirth, controlled by oral hypoglycemic drugs: Secondary | ICD-10-CM | POA: Diagnosis present

## 2018-11-16 DIAGNOSIS — Z23 Encounter for immunization: Secondary | ICD-10-CM

## 2018-11-16 DIAGNOSIS — Z3A39 39 weeks gestation of pregnancy: Secondary | ICD-10-CM

## 2018-11-16 DIAGNOSIS — O99214 Obesity complicating childbirth: Secondary | ICD-10-CM | POA: Diagnosis present

## 2018-11-16 LAB — CBC
HCT: 33.6 % — ABNORMAL LOW (ref 36.0–46.0)
Hemoglobin: 11.1 g/dL — ABNORMAL LOW (ref 12.0–15.0)
MCH: 27 pg (ref 26.0–34.0)
MCHC: 33 g/dL (ref 30.0–36.0)
MCV: 81.8 fL (ref 80.0–100.0)
Platelets: 204 10*3/uL (ref 150–400)
RBC: 4.11 MIL/uL (ref 3.87–5.11)
RDW: 14.6 % (ref 11.5–15.5)
WBC: 12.4 10*3/uL — ABNORMAL HIGH (ref 4.0–10.5)
nRBC: 0 % (ref 0.0–0.2)

## 2018-11-16 LAB — GLUCOSE, CAPILLARY
Glucose-Capillary: 101 mg/dL — ABNORMAL HIGH (ref 70–99)
Glucose-Capillary: 101 mg/dL — ABNORMAL HIGH (ref 70–99)
Glucose-Capillary: 71 mg/dL (ref 70–99)
Glucose-Capillary: 72 mg/dL (ref 70–99)
Glucose-Capillary: 88 mg/dL (ref 70–99)
Glucose-Capillary: 93 mg/dL (ref 70–99)

## 2018-11-16 LAB — ABO/RH: ABO/RH(D): A POS

## 2018-11-16 LAB — COMPREHENSIVE METABOLIC PANEL
ALT: 13 U/L (ref 0–44)
AST: 16 U/L (ref 15–41)
Albumin: 2.4 g/dL — ABNORMAL LOW (ref 3.5–5.0)
Alkaline Phosphatase: 119 U/L (ref 38–126)
Anion gap: 11 (ref 5–15)
BUN: 9 mg/dL (ref 6–20)
CO2: 19 mmol/L — ABNORMAL LOW (ref 22–32)
Calcium: 8.4 mg/dL — ABNORMAL LOW (ref 8.9–10.3)
Chloride: 106 mmol/L (ref 98–111)
Creatinine, Ser: 0.75 mg/dL (ref 0.44–1.00)
GFR calc Af Amer: >60 mL/min (ref >60)
GFR calc non Af Amer: >60 mL/min (ref >60)
Glucose, Bld: 99 mg/dL (ref 70–99)
Potassium: 4.1 mmol/L (ref 3.5–5.1)
Sodium: 136 mmol/L (ref 135–145)
Total Bilirubin: <0.1 mg/dL — ABNORMAL LOW (ref 0.3–1.2)
Total Protein: 5.4 g/dL — ABNORMAL LOW (ref 6.5–8.1)

## 2018-11-16 LAB — TYPE AND SCREEN
ABO/RH(D): A POS
Antibody Screen: NEGATIVE

## 2018-11-16 LAB — PROTEIN / CREATININE RATIO, URINE
Creatinine, Urine: 129.58 mg/dL
Protein Creatinine Ratio: 0.12 mg/mg{Cre} (ref 0.00–0.15)
Total Protein, Urine: 15 mg/dL

## 2018-11-16 LAB — RPR: RPR Ser Ql: NONREACTIVE

## 2018-11-16 MED ORDER — LACTATED RINGERS IV SOLN: 500.0000 mL | Freq: Once | INTRAVENOUS | Status: DC

## 2018-11-16 MED ORDER — EPHEDRINE 5 MG/ML INJ: 10.0000 mg | INTRAVENOUS | Status: DC | PRN

## 2018-11-16 MED ORDER — OXYTOCIN 40 UNITS IN NORMAL SALINE INFUSION - SIMPLE MED: 2.5000 [IU]/h | INTRAVENOUS | Status: DC

## 2018-11-16 MED ORDER — OXYCODONE-ACETAMINOPHEN 5-325 MG PO TABS: 2.0000 | ORAL_TABLET | ORAL | Status: DC | PRN

## 2018-11-16 MED ORDER — OXYTOCIN BOLUS FROM INFUSION
500.0000 mL | Freq: Once | INTRAVENOUS | Status: AC
  Administered 2018-11-17: 500 mL via INTRAVENOUS

## 2018-11-16 MED ORDER — LACTATED RINGERS IV SOLN
INTRAVENOUS | Status: DC
  Administered 2018-11-16 (×2): via INTRAVENOUS

## 2018-11-16 MED ORDER — PHENYLEPHRINE 40 MCG/ML (10ML) SYRINGE FOR IV PUSH (FOR BLOOD PRESSURE SUPPORT)
80.0000 ug | PREFILLED_SYRINGE | INTRAVENOUS | Status: DC | PRN
  Filled 2018-11-16: qty 10

## 2018-11-16 MED ORDER — ONDANSETRON HCL 4 MG/2ML IJ SOLN: 4.0000 mg | Freq: Four times a day (QID) | INTRAMUSCULAR | Status: DC | PRN

## 2018-11-16 MED ORDER — HYDROXYZINE HCL 50 MG PO TABS: 50.0000 mg | ORAL_TABLET | Freq: Four times a day (QID) | ORAL | Status: DC | PRN

## 2018-11-16 MED ORDER — BUTORPHANOL TARTRATE 1 MG/ML IJ SOLN: 1.0000 mg | INTRAMUSCULAR | Status: DC | PRN

## 2018-11-16 MED ORDER — SOD CITRATE-CITRIC ACID 500-334 MG/5ML PO SOLN: 30.0000 mL | ORAL | Status: DC | PRN

## 2018-11-16 MED ORDER — TERBUTALINE SULFATE 1 MG/ML IJ SOLN: 0.2500 mg | Freq: Once | INTRAMUSCULAR | Status: DC | PRN

## 2018-11-16 MED ORDER — LIDOCAINE HCL (PF) 1 % IJ SOLN
INTRAMUSCULAR | Status: DC | PRN
  Administered 2018-11-16 (×2): 5 mL via EPIDURAL

## 2018-11-16 MED ORDER — FLEET ENEMA 7-19 GM/118ML RE ENEM: 1.0000 | ENEMA | RECTAL | Status: DC | PRN

## 2018-11-16 MED ORDER — SODIUM CHLORIDE (PF) 0.9 % IJ SOLN
INTRAMUSCULAR | Status: DC | PRN
  Administered 2018-11-16: 12 mL/h via EPIDURAL

## 2018-11-16 MED ORDER — ZOLPIDEM TARTRATE 5 MG PO TABS: 5.0000 mg | ORAL_TABLET | Freq: Every evening | ORAL | Status: DC | PRN

## 2018-11-16 MED ORDER — FENTANYL-BUPIVACAINE-NACL 0.5-0.125-0.9 MG/250ML-% EP SOLN
12.0000 mL/h | EPIDURAL | Status: DC | PRN
  Filled 2018-11-16: qty 250

## 2018-11-16 MED ORDER — ACETAMINOPHEN 325 MG PO TABS
650.0000 mg | ORAL_TABLET | ORAL | Status: DC | PRN
  Administered 2018-11-16: 650 mg via ORAL
  Filled 2018-11-16: qty 2

## 2018-11-16 MED ORDER — LACTATED RINGERS IV SOLN
500.0000 mL | INTRAVENOUS | Status: DC | PRN
  Administered 2018-11-16: 500 mL via INTRAVENOUS

## 2018-11-16 MED ORDER — LIDOCAINE HCL (PF) 1 % IJ SOLN: 30.0000 mL | INTRAMUSCULAR | Status: DC | PRN

## 2018-11-16 MED ORDER — MISOPROSTOL 25 MCG QUARTER TABLET
25.0000 ug | ORAL_TABLET | ORAL | Status: DC | PRN
  Administered 2018-11-16: 25 ug via VAGINAL
  Filled 2018-11-16: qty 1

## 2018-11-16 MED ORDER — OXYTOCIN 40 UNITS IN NORMAL SALINE INFUSION - SIMPLE MED
1.0000 m[IU]/min | INTRAVENOUS | Status: DC
  Administered 2018-11-16 (×2): 2 m[IU]/min via INTRAVENOUS
  Filled 2018-11-16: qty 1000

## 2018-11-16 MED ORDER — OXYCODONE-ACETAMINOPHEN 5-325 MG PO TABS: 1.0000 | ORAL_TABLET | ORAL | Status: DC | PRN

## 2018-11-16 MED ORDER — DIPHENHYDRAMINE HCL 50 MG/ML IJ SOLN: 12.5000 mg | INTRAMUSCULAR | Status: DC | PRN

## 2018-11-16 MED ORDER — VANCOMYCIN HCL IN DEXTROSE 1-5 GM/200ML-% IV SOLN
1000.0000 mg | Freq: Two times a day (BID) | INTRAVENOUS | Status: DC
  Administered 2018-11-16 (×2): 1000 mg via INTRAVENOUS
  Filled 2018-11-16 (×3): qty 200

## 2018-11-16 NOTE — Progress Notes (Signed)
Margaret Little is a 33 y.o. G2P1001 at [redacted]w[redacted]d admitted for induction of labor due to [facility].  Subjective: Comfortable w/ epidural. Pt reports similar labor pattern of stalled labor w/ previous birth.   Objective: BP 134/87   Pulse 90   Temp 98.8 F (37.1 C) (Oral)   Resp 16   Ht 5\' 7"  (1.702 m)   Wt 132.4 kg   BMI 45.70 kg/m  No intake/output data recorded. Total I/O In: -  Out: 650 [Urine:650]  FHT:  FHR: 130 bpm, variability: moderate,  accelerations:  Present,  decelerations:  Absent UC:   regular, every 3-4 minutes/ MVU 90-100 in past 1 hour SVE:   Dilation: 5 Effacement (%): 70 Station: -2 Exam by:: Dr. Nelda Marseille  Labs: Lab Results  Component Value Date   WBC 12.4 (H) 11/16/2018   HGB 11.1 (L) 11/16/2018   HCT 33.6 (L) 11/16/2018   MCV 81.8 11/16/2018   PLT 204 11/16/2018  Pitocin break @ 1716 w/ max dose 22 mU/min. Pitocin restarted @ 9622, currently @ 8 mU/min.     Assessment / Plan: G2P1001, [redacted]w[redacted]d Induction of labor due to GDM A2 Labor: Continue to increase Pitocin to achieve MVU of 190 or greater x1 hr Fetal Wellbeing:  Category I Pain Control:  Epidural I/D:  GBS pos, adequate treatment w/ Vanc Anticipated MOD:  NSVD  [name] MSN, CNM 11/16/2018, 9:27 PM

## 2018-11-16 NOTE — Anesthesia Procedure Notes (Signed)
Epidural Patient location during procedure: OB Start time: 11/16/2018 3:23 PM End time: 11/16/2018 3:47 PM  Staffing Anesthesiologist: Lidia Collum, MD Performed: anesthesiologist   Preanesthetic Checklist Completed: patient identified, pre-op evaluation, timeout performed, IV checked, risks and benefits discussed and monitors and equipment checked  Epidural Patient position: sitting Prep: DuraPrep Patient monitoring: heart rate, continuous pulse ox and blood pressure Approach: midline Location: L3-L4 Injection technique: LOR air  Needle:  Needle type: Tuohy  Needle gauge: 17 G Needle length: 9 cm Needle insertion depth: 7 cm Catheter type: closed end flexible Catheter size: 19 Gauge Catheter at skin depth: 12 cm Test dose: negative  Assessment Events: blood not aspirated, injection not painful, no injection resistance, negative IV test and no paresthesia  Additional Notes Reason for block:procedure for pain

## 2018-11-16 NOTE — Progress Notes (Signed)
OB PN:  S: Pt resting comfortably, no acute complaints  O: BP (!) 138/95   Pulse 70   Temp 98.8 F (37.1 C) (Oral)   Resp 16   Ht 5\' 7"  (1.702 m)   Wt 132.4 kg   BMI 45.70 kg/m   FHT: 125bpm, moderate variablity, + accels, no decels Toco: q52min SVE: 6/70/-2, IUPC and FSE in place  A/P: 33 y.o. G2P1001 @ [redacted]w[redacted]d for IOL due to GDMA2 1. FWB: Cat. I 2. Labor: plan to restart Pit GDMA2- accuchecks q 4hr, remain within normal limits Gestational HTN: BP high normal, baseline labs negative, will continue to closely monitor Pain: continue epidural GBS: positive, continue Vanc per protocol Morbid obesity: plan for Lovenox following delivery  Janyth Pupa, DO [phone] (cell) [phone] (office)

## 2018-11-16 NOTE — Anesthesia Preprocedure Evaluation (Signed)
Anesthesia Evaluation  Patient identified by MRN, date of birth, ID band Patient awake    Reviewed: Allergy & Precautions, H&P , NPO status , Patient's Chart, lab work & pertinent test results  History of Anesthesia Complications Negative for: history of anesthetic complications  Airway Mallampati: II  TM Distance: >3 FB Neck ROM: full    Dental no notable dental hx.    Pulmonary neg pulmonary ROS,    Pulmonary exam normal        Cardiovascular hypertension, negative cardio ROS Normal cardiovascular exam Rhythm:regular Rate:Normal     Neuro/Psych negative neurological ROS  negative psych ROS   GI/Hepatic negative GI ROS, Neg liver ROS,   Endo/Other  diabetes, GestationalMorbid obesity  Renal/GU negative Renal ROS  negative genitourinary   Musculoskeletal   Abdominal   Peds  Hematology negative hematology ROS (+)   Anesthesia Other Findings   Reproductive/Obstetrics (+) Pregnancy                             Anesthesia Physical Anesthesia Plan  ASA: III  Anesthesia Plan: Epidural   Post-op Pain Management:    Induction:   PONV Risk Score and Plan:   Airway Management Planned:   Additional Equipment:   Intra-op Plan:   Post-operative Plan:   Informed Consent: I have reviewed the patients History and Physical, chart, labs and discussed the procedure including the risks, benefits and alternatives for the proposed anesthesia with the patient or authorized representative who has indicated his/her understanding and acceptance.       Plan Discussed with:   Anesthesia Plan Comments:         Anesthesia Quick Evaluation

## 2018-11-16 NOTE — Progress Notes (Signed)
Margaret Little is a 33 y.o. G2P1001 at [redacted]w[redacted]d   Subjective: Pt states she is starting to feel contractions, not painful yet.  Desires epidural when needed.  Objective: BP (!) 143/89   Pulse 67   Temp 98.6 F (37 C) (Oral)   Resp 18   Ht 5\' 7"  (1.702 m)   Wt 132.4 kg   BMI 45.70 kg/m  No intake/output data recorded. No intake/output data recorded.  FHT:  FHR: 120 bpm, variability: moderate,  accelerations:  Present,  decelerations:  Absent UC:   Not tracing currently, previously q 4 SVE:   Dilation: 2 Effacement (%): 50 Station: -3 Exam by:: amber pope, rn   Labs: Lab Results  Component Value Date   WBC 12.4 (H) 11/16/2018   HGB 11.1 (L) 11/16/2018   HCT 33.6 (L) 11/16/2018   MCV 81.8 11/16/2018   PLT 204 11/16/2018   CMP     Component Value Date/Time   NA 136 11/16/2018 0029   K 4.1 11/16/2018 0029   CL 106 11/16/2018 0029   CO2 19 (L) 11/16/2018 0029   GLUCOSE 99 11/16/2018 0029   BUN 9 11/16/2018 0029   CREATININE 0.75 11/16/2018 0029   CALCIUM 8.4 (L) 11/16/2018 0029   PROT 5.4 (L) 11/16/2018 0029   ALBUMIN 2.4 (L) 11/16/2018 0029   AST 16 11/16/2018 0029   ALT 13 11/16/2018 0029   ALKPHOS 119 11/16/2018 0029   BILITOT <0.1 (L) 11/16/2018 0029   GFRNONAA >60 11/16/2018 0029   GFRAA >60 11/16/2018 0029    PCR 0.12  Assessment / Plan: IUP @ 39 0/7 weeks  Labor: S/p Cytotec x 1.  Started Pitocin at 2 cm. Preeclampsia:  Baseline labs normal.  H/o Gestational HTN.  BP mildly elevated. Fetal Wellbeing:  Category I Pain Control:  Plans epidural I/D:  GBS+.  Vancomycin x1 completed 6:11 am. Anticipated MOD:  NSVD  Thurnell Lose 11/16/2018, 6:35 AM

## 2018-11-16 NOTE — Progress Notes (Signed)
Margaret Little is a 33 y.o. G2P1001 at [redacted]w[redacted]d   Subjective: Pt states she feels mild contractions.  Objective: BP 131/81   Pulse 76   Temp 98.4 F (36.9 C) (Oral)   Resp 16   Ht 5\' 7"  (1.702 m)   Wt 132.4 kg   BMI 45.70 kg/m  No intake/output data recorded. No intake/output data recorded.  Gen:  Pt comfortable. Abd:  No upper abdominal tenderness Neuro:  DTRs not illicited FHT:  711A, moderate variability, accelerations, no decelerations UC:   q 2-3 after IUPC, difficult to assess prior to IUPC SVE:   Dilation: 3.5 Effacement (%): 50 Station: -3 Exam by:: Dr. Simona Huh  Pitocin 16 mUs. AROM clear to place internal monitors due to difficulty tracing the baby and picking up contractions.  Copious amount of clear fluid.  Labs: Lab Results  Component Value Date   WBC 12.4 (H) 11/16/2018   HGB 11.1 (L) 11/16/2018   HCT 33.6 (L) 11/16/2018   MCV 81.8 11/16/2018   PLT 204 11/16/2018   CMP     Component Value Date/Time   NA 136 11/16/2018 0029   K 4.1 11/16/2018 0029   CL 106 11/16/2018 0029   CO2 19 (L) 11/16/2018 0029   GLUCOSE 99 11/16/2018 0029   BUN 9 11/16/2018 0029   CREATININE 0.75 11/16/2018 0029   CALCIUM 8.4 (L) 11/16/2018 0029   PROT 5.4 (L) 11/16/2018 0029   ALBUMIN 2.4 (L) 11/16/2018 0029   AST 16 11/16/2018 0029   ALT 13 11/16/2018 0029   ALKPHOS 119 11/16/2018 0029   BILITOT <0.1 (L) 11/16/2018 0029   GFRNONAA >60 11/16/2018 0029   GFRAA >60 11/16/2018 0029    PCR 0.12  Assessment / Plan: IUP @ 39 0/7 weeks  Labor: Progressing on Pitocin.  S/p AROM Preeclampsia:  Baseline labs normal.  H/o Gestational HTN.  BP mildly elevated. No symptoms of Preeclampsia. Fetal Wellbeing:  Category I Pain Control:  Plans epidural I/D:  GBS+.  Vancomycin x1 completed 6:11 am. Anticipated MOD:  NSVD  Thurnell Lose 11/16/2018, 1:39 PM

## 2018-11-16 NOTE — H&P (Signed)
Margaret Little is a 33 y.o. female G2 P1001 @ 39 0/7 weeks revised by a 12 week ultrasound presenting for IOL due to Gestational Diabetes well controlled with Glyburide 1.25 mg qhs.  Pt is currently without complaints.    Prenatal care with [facility] Margaret Little) complicated as below:  GDM A2 diagnosed at 12 weeks.  Only fasting CBGs were elevated.  Pt has remained on the same dosage since it was started. Pt did not take Glyburide prior to arrival. Morbid obesity-Fetal growth by ultrasound has been normal. 9/9/206 lbs 8 oz (59%) at 36 weeks. GBS+ PCN allergy, resistant to Vancomycin. H/o Gestational HTN- BP has been normal.  OB History    Gravida  2   Para  1   Term  1   Preterm  0   AB  0   Living  1     SAB  0   TAB  0   Ectopic  0   Multiple  0   Live Births  1          Past Medical History:  Diagnosis Date  . Gestational diabetes   . Headache    migraines; takes Tylenol  . Hypertension   . Vaginal Pap smear, abnormal    repeat was normal   Past Surgical History:  Procedure Laterality Date  . WISDOM TOOTH EXTRACTION     Family History: family history includes Thyroid disease in her maternal grandmother. Social History:  reports that she has never smoked. She has never used smokeless tobacco. She reports that she does not drink alcohol or use drugs.     Maternal Diabetes: Yes:  Diabetes Type:  Insulin/Medication controlled Genetic Screening: Normal Maternal Ultrasounds/Referrals: Normal Fetal Ultrasounds or other Referrals:  None Maternal Substance Abuse:  No Significant Maternal Medications:  Meds include: Other: Glyburide Significant Maternal Lab Results:  Group B Strep positive Other Comments:  Morbid obesity  Review of Systems  Constitutional: Negative for chills and fever.  Gastrointestinal: Negative for abdominal pain.  Neurological: Negative for headaches.   Maternal Medical History:  Fetal activity: Perceived fetal activity is normal.     Prenatal Complications - Diabetes: gestational. Diabetes is managed by oral agent (monotherapy).      Dilation: 1.5 Effacement (%): 50 Station: -3 Exam by:: Margaret pope, rn  Temperature 54 F (36.7 C), temperature source Oral, height 5\' 7"  (1.702 m), weight 132.4 kg, unknown if currently breastfeeding. Maternal Exam:  Uterine Assessment: Contraction frequency is rare.   Abdomen: Patient reports no abdominal tenderness. Pelvis: adequate for delivery.      Fetal Exam Fetal Monitor Review: Mode: fetoscope.   Baseline rate: 130s.  Pattern: accelerations present.    Fetal State Assessment: Category I - tracings are normal.     Physical Exam  Constitutional: She is oriented to person, place, and time. She appears well-developed and well-nourished. No distress.  HENT:  Head: Normocephalic and atraumatic.  Eyes: EOM are normal.  Neck: Normal range of motion.  Respiratory: Effort normal. No respiratory distress.  GI: There is no abdominal tenderness.  Musculoskeletal: Normal range of motion.        General: No tenderness.  Neurological: She is alert and oriented to person, place, and time.  Skin: Skin is warm and dry.  Psychiatric: She has a normal mood and affect.    Prenatal labs: ABO, Rh: --/--/A POS, A POS Performed at [facility], [place] [address], [place], [place] [address]  [phone]) Antibody: NEG (  09/24 0029) Rubella: Immune (02/20 0000) RPR: Nonreactive (02/20 0000)  HBsAg: Negative (02/20 0000)  HIV: Non-reactive (02/20 0000)  GBS:   Positive  Assessment/Plan: IUP @ 39 0/7 weeks IOL due to GDM A2-well controlled  Start with Cytotec. GDM A2  Check CBGs q 4 hours. GBS+  Vancomycin with labor or SROM. Morbid obesity H/o Gestational HTN  Get baseline labs.   Margaret Little 11/16/2018, 2:14 AM

## 2018-11-17 LAB — COMPREHENSIVE METABOLIC PANEL
ALT: 13 U/L (ref 0–44)
AST: 19 U/L (ref 15–41)
Albumin: 2.1 g/dL — ABNORMAL LOW (ref 3.5–5.0)
Alkaline Phosphatase: 102 U/L (ref 38–126)
Anion gap: 8 (ref 5–15)
BUN: 6 mg/dL (ref 6–20)
CO2: 23 mmol/L (ref 22–32)
Calcium: 8.5 mg/dL — ABNORMAL LOW (ref 8.9–10.3)
Chloride: 108 mmol/L (ref 98–111)
Creatinine, Ser: 1 mg/dL (ref 0.44–1.00)
GFR calc Af Amer: >60 mL/min (ref >60)
GFR calc non Af Amer: >60 mL/min (ref >60)
Glucose, Bld: 128 mg/dL — ABNORMAL HIGH (ref 70–99)
Potassium: 3.7 mmol/L (ref 3.5–5.1)
Sodium: 139 mmol/L (ref 135–145)
Total Bilirubin: 0.3 mg/dL (ref 0.3–1.2)
Total Protein: 5.2 g/dL — ABNORMAL LOW (ref 6.5–8.1)

## 2018-11-17 LAB — CREATININE, SERUM
Creatinine, Ser: 0.73 mg/dL (ref 0.44–1.00)
GFR calc Af Amer: >60 mL/min (ref >60)
GFR calc non Af Amer: >60 mL/min (ref >60)

## 2018-11-17 LAB — CBC
HCT: 29.9 % — ABNORMAL LOW (ref 36.0–46.0)
HCT: 30 % — ABNORMAL LOW (ref 36.0–46.0)
Hemoglobin: 9.9 g/dL — ABNORMAL LOW (ref 12.0–15.0)
Hemoglobin: 9.6 g/dL — ABNORMAL LOW (ref 12.0–15.0)
MCH: 26.8 pg (ref 26.0–34.0)
MCH: 26.2 pg (ref 26.0–34.0)
MCHC: 33.1 g/dL (ref 30.0–36.0)
MCHC: 32 g/dL (ref 30.0–36.0)
MCV: 81 fL (ref 80.0–100.0)
MCV: 81.7 fL (ref 80.0–100.0)
Platelets: 189 10*3/uL (ref 150–400)
Platelets: 188 10*3/uL (ref 150–400)
RBC: 3.69 MIL/uL — ABNORMAL LOW (ref 3.87–5.11)
RBC: 3.67 MIL/uL — ABNORMAL LOW (ref 3.87–5.11)
RDW: 14.5 % (ref 11.5–15.5)
RDW: 14.5 % (ref 11.5–15.5)
WBC: 14.5 10*3/uL — ABNORMAL HIGH (ref 4.0–10.5)
WBC: 11.8 10*3/uL — ABNORMAL HIGH (ref 4.0–10.5)
nRBC: 0 % (ref 0.0–0.2)
nRBC: 0 % (ref 0.0–0.2)

## 2018-11-17 LAB — PROTEIN / CREATININE RATIO, URINE
Creatinine, Urine: 86.78 mg/dL
Protein Creatinine Ratio: 0.08 mg/mg{Cre} (ref 0.00–0.15)
Total Protein, Urine: 7 mg/dL

## 2018-11-17 LAB — GLUCOSE, RANDOM: Glucose, Bld: 90 mg/dL (ref 70–99)

## 2018-11-17 MED ORDER — ACETAMINOPHEN 325 MG PO TABS: 650.0000 mg | ORAL_TABLET | ORAL | Status: DC | PRN

## 2018-11-17 MED ORDER — DIPHENHYDRAMINE HCL 25 MG PO CAPS: 25.0000 mg | ORAL_CAPSULE | Freq: Four times a day (QID) | ORAL | Status: DC | PRN

## 2018-11-17 MED ORDER — PRENATAL MULTIVITAMIN CH
1.0000 | ORAL_TABLET | Freq: Every day | ORAL | Status: DC
  Administered 2018-11-17: 1 via ORAL
  Filled 2018-11-17: qty 1

## 2018-11-17 MED ORDER — INFLUENZA VAC SPLIT QUAD 0.5 ML IM SUSY
0.5000 mL | PREFILLED_SYRINGE | INTRAMUSCULAR | Status: AC
  Administered 2018-11-18: 0.5 mL via INTRAMUSCULAR

## 2018-11-17 MED ORDER — SIMETHICONE 80 MG PO CHEW: 80.0000 mg | CHEWABLE_TABLET | ORAL | Status: DC | PRN

## 2018-11-17 MED ORDER — COCONUT OIL OIL
1.0000 "application " | TOPICAL_OIL | Status: DC | PRN
  Administered 2018-11-17: 1 via TOPICAL

## 2018-11-17 MED ORDER — ONDANSETRON HCL 4 MG/2ML IJ SOLN: 4.0000 mg | INTRAMUSCULAR | Status: DC | PRN

## 2018-11-17 MED ORDER — ENOXAPARIN SODIUM 80 MG/0.8ML ~~LOC~~ SOLN
0.5000 mg/kg | SUBCUTANEOUS | Status: DC
  Administered 2018-11-17: 65 mg via SUBCUTANEOUS
  Filled 2018-11-17: qty 0.8

## 2018-11-17 MED ORDER — SENNOSIDES-DOCUSATE SODIUM 8.6-50 MG PO TABS
2.0000 | ORAL_TABLET | ORAL | Status: DC
  Administered 2018-11-17: 2 via ORAL
  Filled 2018-11-17: qty 2

## 2018-11-17 MED ORDER — BENZOCAINE-MENTHOL 20-0.5 % EX AERO
1.0000 "application " | INHALATION_SPRAY | CUTANEOUS | Status: DC | PRN
  Filled 2018-11-17: qty 56

## 2018-11-17 MED ORDER — TETANUS-DIPHTH-ACELL PERTUSSIS 5-2.5-18.5 LF-MCG/0.5 IM SUSP
0.5000 mL | Freq: Once | INTRAMUSCULAR | Status: AC
  Administered 2018-11-18: 0.5 mL via INTRAMUSCULAR

## 2018-11-17 MED ORDER — ONDANSETRON HCL 4 MG PO TABS: 4.0000 mg | ORAL_TABLET | ORAL | Status: DC | PRN

## 2018-11-17 MED ORDER — WITCH HAZEL-GLYCERIN EX PADS: 1.0000 "application " | MEDICATED_PAD | CUTANEOUS | Status: DC | PRN

## 2018-11-17 MED ORDER — DIBUCAINE (PERIANAL) 1 % EX OINT: 1.0000 "application " | TOPICAL_OINTMENT | CUTANEOUS | Status: DC | PRN

## 2018-11-17 MED ORDER — IBUPROFEN 600 MG PO TABS
600.0000 mg | ORAL_TABLET | Freq: Four times a day (QID) | ORAL | Status: DC
  Administered 2018-11-17 – 2018-11-18 (×5): 600 mg via ORAL
  Filled 2018-11-17 (×5): qty 1

## 2018-11-17 NOTE — Anesthesia Postprocedure Evaluation (Signed)
Anesthesia Post Note  Patient: Margaret Little  Procedure(s) Performed: AN AD HOC LABOR EPIDURAL     Patient location during evaluation: Mother Baby Anesthesia Type: Epidural Level of consciousness: awake and alert Pain management: pain level controlled Vital Signs Assessment: post-procedure vital signs reviewed and stable Respiratory status: spontaneous breathing, nonlabored ventilation and respiratory function stable Cardiovascular status: stable Postop Assessment: no headache, no backache and epidural receding Anesthetic complications: no    Last Vitals:  Vitals:   11/17/18 0338 11/17/18 0445  BP: 140/81 127/82  Pulse: 64 78  Resp: 20 18  Temp: 36.7 C 36.6 C  SpO2: 100% 99%    Last Pain:  Vitals:   11/17/18 0505  TempSrc:   PainSc: 1    Pain Goal: Patients Stated Pain Goal: 3 (11/16/18 2303)                 Rayvon Char

## 2018-11-17 NOTE — Lactation Note (Signed)
This note was copied from a baby's chart. Lactation Consultation Note  Patient Name: Boy Laquanna Veazey [id number] Date: 11/17/2018 Reason for consult: Initial assessment;Term  1604 - I conducted an initial consult with Ms. Gelinas. She states that her son, Haze Boyden, latched immediately after delivery and has been breast feeding consistently. She referred to him as a little "snapping turtle" with the breast. She did have a vertical compression stripe on her everted left nipple, which she states she received at one of the first feedings.  Baby fed just prior to entry, and I was unable to observe a feeding. I encouraged her to call when ready for lactation tonight for follow up.   Ms. Wolters breast fed her first child, now 78 months, for three months. She reports that the first baby was a "lazy feeder," and she also had to discontinue breast feeding due to pain caused by psoriasis on her nipples. She feels that Haze Boyden is a more assertive feeder at this stage than her first.  I provided her with comfort gels for her left nipple, and I asked her RN to provide her with coconut oil.  I reviewed the [employer] guide key points and basic breast feeding education. I recommended that she breast feed 8-12 times a day on demand, waking to feed as needed.   Ms. Freeman owns a personal pump and has a manual pump. She also has [facility].  I discussed the benefits of breast feeding and the risks of formula, and I encouraged exclusive breast feeding for the first six months of life or as long as Ms. Pickron is able.   Ms. Hettich and her support person asked great questions and expressed enthusiasm about breast feeding.    Maternal Data Formula Feeding for Exclusion: No Has patient been taught Hand Expression?: Yes Does the patient have breastfeeding experience prior to this delivery?: Yes  Feeding Feeding Type: Breast Fed   Interventions Interventions: Breast feeding basics reviewed;Hand  express  Lactation Tools Discussed/Used Tools: Other (comment)(spoon) WIC Program: Yes   Consult Status Consult Status: Follow-up Date: 11/18/18 Follow-up type: In-patient    Lenore Manner 11/17/2018, 5:22 PM

## 2018-11-17 NOTE — Progress Notes (Signed)
Tracing reviewed in-house cat 1 MVUs inadequate Cont to titrate pitocin as indicated

## 2018-11-17 NOTE — Progress Notes (Signed)
Post Partum Day 0 Subjective: no complaints, up ad lib, voiding, tolerating PO and she denies headache visual disturbances or RUQ pain   Objective: Blood pressure 138/80, pulse 66, temperature 98.4 F (36.9 C), temperature source Oral, resp. rate 16, height 5\' 7"  (1.702 m), weight 132.4 kg, SpO2 100 %, unknown if currently breastfeeding.  Physical Exam:  General: alert, cooperative and no distress Lochia: appropriate Uterine Fundus: firm Incision: NA DVT Evaluation: No evidence of DVT seen on physical exam.  Recent Labs    11/17/18 0549 11/17/18 1532  HGB 9.9* 9.6*  HCT 29.9* 30.0*    Assessment/Plan: Breastfeeding and Lactation consult  Elevated bp without diagnosis of hypertension CMP pending.  Will monitor bp closely  Routine postpartum care    LOS: 1 day   Christophe Louis 11/17/2018, 6:23 PM

## 2018-11-18 ENCOUNTER — Encounter (HOSPITAL_COMMUNITY): Payer: Self-pay | Admitting: *Deleted

## 2018-11-18 MED ORDER — IBUPROFEN 600 MG PO TABS: 600.0000 mg | ORAL_TABLET | Freq: Four times a day (QID) | ORAL | 0 refills | Status: AC | PRN

## 2018-11-18 NOTE — Progress Notes (Signed)
Post Partum Day 1 s/p SVD Subjective: no complaints, up ad lib, voiding and tolerating PO patient denies headache visual disturbances are ruq pain . She is interested in discharge home today if repeat bp normal   Objective: Blood pressure 133/82, pulse 70, temperature 98.3 F (36.8 C), temperature source Oral, resp. rate 18, height 5\' 7"  (1.702 m), weight 132.4 kg, SpO2 100 %, unknown if currently breastfeeding.  Physical Exam:  General: alert, cooperative and no distress Lochia: appropriate Uterine Fundus: firm Incision: NA DVT Evaluation: No evidence of DVT seen on physical exam.  Recent Labs    11/17/18 0549 11/17/18 1532  HGB 9.9* 9.6*  HCT 29.9* 30.0*    Assessment/Plan: Discharge home and Breastfeeding  Gestational hypertension bp no normal no medications - plan bp in office early next week     LOS: 2 days   Margaret Little 11/18/2018, 10:36 AM

## 2018-11-18 NOTE — Discharge Summary (Signed)
OB Discharge Summary     Patient Name: Margaret Little DOB: 1985-09-21 MRN: [id number]  Date of admission: 11/16/2018 Delivering MD: Burman Foster B   Date of discharge: 11/18/2018  Admitting diagnosis: pregnancy Intrauterine pregnancy: [redacted]w[redacted]d     Secondary diagnosis:  Principal Problem:   NSVD (9/25) Active Problems:   Gestational diabetes mellitus (GDM) controlled on oral hypoglycemic drug   Second degree perineal laceration during delivery  Additional problems: None     Discharge diagnosis: GDM A2   And term pregnancy delivered  / gestational hypertension                                                                                             Post partum procedures:None  Augmentation: AROM, Pitocin and Cytotec  Complications: None  Hospital course:  Induction of Labor With Vaginal Delivery   33 y.o. yo G2P1001 at [redacted]w[redacted]d was admitted to the hospital 11/16/2018 for induction of labor.  Indication for induction: A2 DM.  Patient had an uncomplicated labor course as follows: Membrane Rupture Time/Date: 1:27 PM ,11/16/2018   Intrapartum Procedures: Episiotomy: None [1]                                         Lacerations:  2nd degree [3];Perineal [11]  Patient had delivery of a Viable infant.  Information for the patient's newborn:  Adaora, Mchaney [[id number]]  Delivery Method: Vaginal, Spontaneous(Filed from Delivery Summary)    11/17/2018  Details of delivery can be found in separate delivery note.  Patient had a routine postpartum course. Patient is discharged home 11/18/18.  Physical exam  Vitals:   11/17/18 1631 11/17/18 2313 11/18/18 0533 11/18/18 1026  BP: 138/80 (!) 141/85 (!) 126/96 133/82  Pulse: 66 66 71 70  Resp: 16 18 18 18   Temp: 98.4 F (36.9 C) 98.1 F (36.7 C) 98.3 F (36.8 C)   TempSrc: Oral Oral Oral   SpO2: 100% 100% 99% 100%  Weight:      Height:       General: alert, cooperative and no distress Lochia: appropriate Uterine Fundus:  firm Incision: N/A DVT Evaluation: No evidence of DVT seen on physical exam. Labs: Lab Results  Component Value Date   WBC 11.8 (H) 11/17/2018   HGB 9.6 (L) 11/17/2018   HCT 30.0 (L) 11/17/2018   MCV 81.7 11/17/2018   PLT 188 11/17/2018   CMP Latest Ref Rng & Units 11/17/2018  Glucose 70 - 99 mg/dL 128(H)  BUN 6 - 20 mg/dL 6  Creatinine 0.44 - 1.00 mg/dL 1.00  Sodium 135 - 145 mmol/L 139  Potassium 3.5 - 5.1 mmol/L 3.7  Chloride 98 - 111 mmol/L 108  CO2 22 - 32 mmol/L 23  Calcium 8.9 - 10.3 mg/dL 8.5(L)  Total Protein 6.5 - 8.1 g/dL 5.2(L)  Total Bilirubin 0.3 - 1.2 mg/dL 0.3  Alkaline Phos 38 - 126 U/L 102  AST 15 - 41 U/L 19  ALT 0 - 44 U/L 13    Discharge instruction:  per After Visit Summary and "Baby and Me Booklet".  After visit meds:  Allergies as of 11/18/2018      Reactions   Amoxicillin Hives   Penicillins Hives   Has patient had a PCN reaction causing immediate rash, facial/tongue/throat swelling, SOB or lightheadedness with hypotension: No Has patient had a PCN reaction causing severe rash involving mucus membranes or skin necrosis: No Has patient had a PCN reaction that required hospitalization: No Has patient had a PCN reaction occurring within the last 10 years: No If all of the above answers are "NO", then may proceed with Cephalosporin use.      Medication List    STOP taking these medications   glyBURIDE 1.25 MG tablet Commonly known as: DIABETA     TAKE these medications   acetaminophen 500 MG tablet Commonly known as: TYLENOL Take 500 mg by mouth every 6 (six) hours as needed for mild pain.   [employer] PRENATAL GUMMY PO Take 1 tablet by mouth daily.   ibuprofen 600 MG tablet Commonly known as: ADVIL Take 1 tablet (600 mg total) by mouth every 6 (six) hours as needed. What changed:   when to take this  reasons to take this       Diet: routine diet  Activity: Advance as tolerated. Pelvic rest for 6 weeks.   Outpatient follow up:2 days  for bp check  Follow up Appt:No future appointments. Follow up Visit:No follow-ups on file.  Postpartum contraception: Not Discussed  Newborn Data: Live born female  Birth Weight: 8 lb 0.9 oz (3654 g) APGAR: 8, 9  Newborn Delivery   Birth date/time: 11/17/2018 00:59:00 Delivery type: Vaginal, Spontaneous      Baby Feeding: Breast Disposition:home with mother   11/18/2018 Gerald Leitz, MD

## 2018-11-20 MED FILL — IBUPROFEN 600 MG TABLET: 600 | 7 days supply | Qty: 30 | Fill #0

## 2019-01-15 ENCOUNTER — Other Ambulatory Visit: Payer: Self-pay

## 2019-01-15 DIAGNOSIS — Z20822 Contact with and (suspected) exposure to covid-19: Secondary | ICD-10-CM

## 2019-01-16 LAB — NOVEL CORONAVIRUS, NAA: SARS-CoV-2, NAA: NOT DETECTED

## 2020-05-05 ENCOUNTER — Other Ambulatory Visit (HOSPITAL_COMMUNITY): Payer: Self-pay | Admitting: Student

## 2020-05-05 ENCOUNTER — Emergency Department (HOSPITAL_COMMUNITY)
Admission: EM | Admit: 2020-05-05 | Discharge: 2020-05-05 | Disposition: A | Discharge status: Home / Self Care | Payer: Medicaid Other | Attending: Emergency Medicine | Admitting: Emergency Medicine

## 2020-05-05 ENCOUNTER — Encounter (HOSPITAL_COMMUNITY): Payer: Self-pay

## 2020-05-05 ENCOUNTER — Other Ambulatory Visit: Payer: Self-pay

## 2020-05-05 ENCOUNTER — Other Ambulatory Visit (HOSPITAL_BASED_OUTPATIENT_CLINIC_OR_DEPARTMENT_OTHER): Payer: Self-pay

## 2020-05-05 DIAGNOSIS — L538 Other specified erythematous conditions: Secondary | ICD-10-CM

## 2020-05-05 DIAGNOSIS — L409 Psoriasis, unspecified: Secondary | ICD-10-CM | POA: Insufficient documentation

## 2020-05-05 DIAGNOSIS — I10 Essential (primary) hypertension: Secondary | ICD-10-CM | POA: Insufficient documentation

## 2020-05-05 DIAGNOSIS — L532 Erythema marginatum: Secondary | ICD-10-CM | POA: Diagnosis not present

## 2020-05-05 DIAGNOSIS — R21 Rash and other nonspecific skin eruption: Secondary | ICD-10-CM | POA: Diagnosis present

## 2020-05-05 MED ORDER — KETOCONAZOLE 2 % EX SHAM
MEDICATED_SHAMPOO | CUTANEOUS | 0 refills | Status: AC
  Filled 2020-05-05 (×2): qty 120, 30d supply, fill #0

## 2020-05-05 MED ORDER — HYDROCORTISONE 1 % EX CREA
TOPICAL_CREAM | Freq: Two times a day (BID) | CUTANEOUS | 0 refills | Status: AC
  Filled 2020-05-05: qty 28, 14d supply, fill #0
  Filled 2020-05-05: qty 28.35, 7d supply, fill #0
  Filled 2020-05-05: qty 28, 7d supply, fill #0

## 2020-05-05 MED ORDER — PREDNISONE 50 MG PO TABS: 50.0000 mg | ORAL_TABLET | Freq: Every day | ORAL | 0 refills | Status: AC

## 2020-05-05 MED ORDER — HYDROXYZINE PAMOATE 25 MG PO CAPS
ORAL_CAPSULE | ORAL | 0 refills | Status: AC
  Filled 2020-05-05 (×2): qty 21, 7d supply, fill #0

## 2020-05-05 NOTE — ED Provider Notes (Signed)
[facility]-EMERGENCY DEPT Provider Note   CSN: [id number] Arrival date & time: 05/05/20  1132     History Chief Complaint  Patient presents with  . Psoriasis    Margaret Little is a 35 y.o. female.  HPI   Patient with significant medical history of psoriasis presents with chief complaint of diffuse rash.  She endorses the rash has gotten worse since Friday, originally started on her neck and arms but now has spread down both of her limbs as well as abdomen.  She describes it as a itchy burning, sensation, with worsening skin chafing, she denies any respiratory issues, no lip or tongue swelling, no difficulty with swallowing.  She denies any alleviating factors,, denies recent changes in medications, starting new foods, new detergents.  Patient endorses that she has had psoriasis in the past but she has never had it spread like this before.  She was taking steroids about 3 years ago but this was stopped during her pregnancy.  She is scheduled with a new patient visit with a dermatologist in August.  Patient denies headaches, fevers, chills, shortness of breath, chest pain, abdominal pain, nausea, vomiting, diarrhea, worsening pedal edema.  Past Medical History:  Diagnosis Date  . Gestational diabetes   . Headache    migraines; takes Tylenol  . Hypertension   . Vaginal Pap smear, abnormal    repeat was normal    Patient Active Problem List   Diagnosis Date Noted  . NSVD (9/25) 11/17/2018  . Second degree perineal laceration during delivery 11/17/2018  . Gestational diabetes mellitus (GDM) controlled on oral hypoglycemic drug 11/16/2018  . Gestational hypertension w/o significant proteinuria in 3rd trimester 04/26/2017    Past Surgical History:  Procedure Laterality Date  . WISDOM TOOTH EXTRACTION       OB History    Gravida  2   Para  1   Term  1   Preterm  0   AB  0   Living  1     SAB  0   IAB  0   Ectopic  0   Multiple  0   Live  Births  1           Family History  Problem Relation Age of Onset  . Thyroid disease Maternal Grandmother     Social History   Tobacco Use  . Smoking status: Never Smoker  . Smokeless tobacco: Never Used  Vaping Use  . Vaping Use: Never used  Substance Use Topics  . Alcohol use: No  . Drug use: No    Home Medications Prior to Admission medications   Medication Sig Start Date End Date Taking? Authorizing Provider  predniSONE (DELTASONE) 50 MG tablet Take 1 tablet (50 mg total) by mouth daily for 5 days. 05/05/20 05/10/20 Yes Carroll Sage, PA-C  acetaminophen (TYLENOL) 500 MG tablet Take 500 mg by mouth every 6 (six) hours as needed for mild pain.    [provider]  hydrocortisone cream 1 % Apply 1 application on the skin twice a day 05/05/20     hydrOXYzine (VISTARIL) 25 MG capsule Take 1 capsule by mouth three times a day 05/05/20     ibuprofen (ADVIL) 600 MG tablet Take 1 tablet (600 mg total) by mouth every 6 (six) hours as needed. 11/18/18   Gerald Leitz, MD  ketoconazole (NIZORAL) 2 % shampoo Apply 1 application on the skin daily 05/05/20     Prenatal MV & Min w/FA-DHA ([facility]  PRENATAL GUMMY PO) Take 1 tablet by mouth daily.    [provider]    Allergies    Amoxicillin and Penicillins  Review of Systems   Review of Systems  Constitutional: Negative for chills and fever.  HENT: Negative for congestion and sore throat.   Respiratory: Negative for shortness of breath.   Cardiovascular: Negative for chest pain.  Gastrointestinal: Negative for abdominal pain, diarrhea, nausea and vomiting.  Genitourinary: Negative for enuresis.  Musculoskeletal: Negative for back pain.  Skin: Positive for color change and rash.  Neurological: Negative for headaches.  Hematological: Does not bruise/bleed easily.    Physical Exam Updated Vital Signs BP (!) 164/118 (BP Location: Right Arm)   Pulse (!) 116   Temp 98.9 F (37.2 C) (Oral)   Resp 18   Ht 5\' 7"   (1.702 m)   Wt 121.6 kg   SpO2 100%   BMI 41.97 kg/m   Physical Exam Vitals and nursing note reviewed.  Constitutional:      General: She is not in acute distress.    Appearance: She is not ill-appearing.  HENT:     Head: Normocephalic and atraumatic.     Nose: No congestion.     Mouth/Throat:     Mouth: Mucous membranes are moist.     Pharynx: Oropharynx is clear. No oropharyngeal exudate or posterior oropharyngeal erythema.     Comments: Oropharynx was visualized tongue and uvula were both midline, controlling oral secretions without difficulty, no lesions present. Eyes:     Conjunctiva/sclera: Conjunctivae normal.  Cardiovascular:     Rate and Rhythm: Normal rate and regular rhythm.     Pulses: Normal pulses.     Heart sounds: No murmur heard. No friction rub. No gallop.   Pulmonary:     Effort: No respiratory distress.     Breath sounds: No wheezing, rhonchi or rales.  Abdominal:     Palpations: Abdomen is soft.     Tenderness: There is no abdominal tenderness.  Skin:    General: Skin is warm and dry.     Findings: Rash present.     Comments: Patient has a noted diffuse rash on her extremities as well as her abdomen and back.  She has noted scaly raised patches on her knees, arms, skin flakes when brushed, it is warm to the touch.  She also has a noted papule-like rash which is noted throughout her body, blanches, does not shave when brushed, it is warm to the touch.  Neurological:     Mental Status: She is alert.  Psychiatric:        Mood and Affect: Mood normal.               ED Results / Procedures / Treatments   Labs (all labs ordered are listed, but only abnormal results are displayed) Labs Reviewed - No data to display  EKG None  Radiology No results found.  Procedures Procedures   Medications Ordered in ED Medications - No data to display  ED Course  I have reviewed the triage vital signs and the nursing notes.  Pertinent labs &  imaging results that were available during my care of the patient were reviewed by me and considered in my medical decision making (see chart for details).    MDM Rules/Calculators/A&P                         Initial impression-patient presents with a diffuse rash.  She is alert, does not appear in acute distress, vital signs reassuring.  Work-up-due to well-appearing patient, benign physical exam further lab imaging not warranted at this time.  Rule out-low suspicion for Trudie Buckler or TEN as there is no noted skin sloughing, there is no lesions on the oral mucosa.  Low suspicion for anaphylactic shock as patient's vital signs are reassuring, there is no airway compromise, patient has had no recent environmental exposures.  Low suspicion for systemic infection as patient is nontoxic-appearing, vital signs reassuring, no obvious infection on my exam.  Low suspicion for disseminated gonorrhea as there is no oral mucosa lesions, patient denies urinary or vaginal abnormalities.    Plan- I suspect patient has 2 types of rashes, one is psoriasis the other acute rash is a erythematous rash.  Will start patient on steroids, recommend H2 blockers have her follow-up with dermatology for further evaluation.  Vital signs have remained stable, no indication for hospital admission.  Patient discussed with attending and they agreed with assessment and plan.  Patient given at home care as well strict return precautions.  Patient verbalized that they understood agreed to said plan.   Final Clinical Impression(s) / ED Diagnoses Final diagnoses:  Psoriasis  Macular erythematous rash    Rx / DC Orders ED Discharge Orders         Ordered    predniSONE (DELTASONE) 50 MG tablet  Daily        05/05/20 1318           Barnie Del 05/05/20 1338    Mancel Bale, MD 05/07/20 2212

## 2020-05-05 NOTE — ED Triage Notes (Signed)
Patient reports a history of psoriasis that has gotten worse recently and is now all over her body causing itching and unable to sleep.   Skin inflamed all over body noted by this RN.    Patient unable to get a dermatology appointment until end of year.   Patient denies new foods or soap.   Patient denies taking a prescription for psoriasis   A/Ox4 Ambulatory in triage

## 2020-05-05 NOTE — Discharge Instructions (Signed)
I suspect you have 2 separate rashes 1 is your psoriasis the other is an erythematous rash.  I have started you on steroids please take as prescribed.  You may also take Claritin daily as this can help with itchiness.  I want you to call your dermatologist today to let them know that you were seen here in the emergency department and your rash has gotten worse, see if the office wants to see you earlier.  Come back to the emergency department if you develop chest pain, shortness of breath, severe abdominal pain, uncontrolled nausea, vomiting, diarrhea.

## 2020-05-05 NOTE — ED Provider Notes (Signed)
  Face-to-face evaluation   History: She presents for evaluation of a rash that started 3 days ago, in addition to her chronic ongoing psoriasis rash.  She complains of a generalized sensation of burning and itching.  No known new medications, contacts or illnesses.  She reports being under some stress.  She works as a Building surveyor.  She denies rhinorrhea, fever, sneezing or cough.  There are no other known modifying factors.  Physical exam: Middle-aged female, obese, alert and cooperative.  No respiratory distress.  Skin with generalized rash, components of which appear urticarial.  There are also components of clear psoriasis type of rash on upper and lower extremities.  Medical screening examination/treatment/procedure(s) were conducted as a shared visit with non-physician practitioner(s) and myself.  I personally evaluated the patient during the encounter    Mancel Bale, MD 05/07/20 2211

## 2020-05-06 ENCOUNTER — Other Ambulatory Visit (HOSPITAL_BASED_OUTPATIENT_CLINIC_OR_DEPARTMENT_OTHER): Payer: Self-pay

## 2020-05-07 ENCOUNTER — Other Ambulatory Visit (HOSPITAL_BASED_OUTPATIENT_CLINIC_OR_DEPARTMENT_OTHER): Payer: Self-pay

## 2020-05-07 ENCOUNTER — Other Ambulatory Visit (HOSPITAL_COMMUNITY): Payer: Self-pay | Admitting: Physician Assistant

## 2020-05-07 ENCOUNTER — Other Ambulatory Visit: Payer: Self-pay

## 2020-05-07 ENCOUNTER — Encounter (HOSPITAL_COMMUNITY): Payer: Self-pay

## 2020-05-07 ENCOUNTER — Emergency Department (HOSPITAL_COMMUNITY)
Admission: EM | Admit: 2020-05-07 | Discharge: 2020-05-07 | Disposition: A | Discharge status: Home / Self Care | Payer: Medicaid Other | Attending: Emergency Medicine | Admitting: Emergency Medicine

## 2020-05-07 DIAGNOSIS — I10 Essential (primary) hypertension: Secondary | ICD-10-CM | POA: Diagnosis not present

## 2020-05-07 DIAGNOSIS — R21 Rash and other nonspecific skin eruption: Secondary | ICD-10-CM | POA: Insufficient documentation

## 2020-05-07 DIAGNOSIS — L409 Psoriasis, unspecified: Secondary | ICD-10-CM | POA: Diagnosis not present

## 2020-05-07 MED ORDER — CLINDAMYCIN HCL 150 MG PO CAPS: 450.0000 mg | ORAL_CAPSULE | Freq: Three times a day (TID) | ORAL | 0 refills | Status: AC

## 2020-05-07 NOTE — Discharge Instructions (Addendum)
As we discussed today I am concerned that you may have a bacterial infection causing the rash that does not appear to be from your psoriasis. Please call your dermatologist to tell them you have had worsening to see if they can see you sooner. Additionally as we discussed if you require additional emergency room evaluation I would recommend considering going somewhere like [facility] in [place] as they are more likely to have dermatology available.    Please take a picture of your rash every day or twice a day if you feel like your rash is changing.   Please start taking either Allegra, Claritin, or Zyrtec. If you develop fevers, worsening symptoms or have other concerns please do not hesitate to seek medical care and evaluation. Additionally as we discussed you should watch for the development of rash or abnormal areas on mucous membranes including inside your mouth and on your vulva.  If you develop these then you need to return to the emergency room.  You may have diarrhea from the antibiotics.  It is very important that you continue to take the antibiotics even if you get diarrhea unless a medical professional tells you that you may stop taking them.  If you stop too early the bacteria you are being treated for will become stronger and you may need different, more powerful antibiotics that have more side effects and worsening diarrhea.  Please stay well hydrated and consider probiotics as they may decrease the severity of your diarrhea.  Please be aware that if you take any hormonal contraception (birth control pills, nexplanon, the ring, etc) that your birth control will not work while you are taking antibiotics and you need to use back up protection as directed on the birth control medication information insert.   While in the ED your blood pressure was high.  Please follow up with your primary care doctor or the wellness clinic for repeat evaluation as you may need medication.  High  blood pressure can cause long term, potentially serious, damage if left untreated.

## 2020-05-07 NOTE — ED Triage Notes (Signed)
Pt arrived POV, c/o worsening rash on entire body. States she was seen for same, given prednisone with no relief.

## 2020-05-07 NOTE — ED Provider Notes (Signed)
[facility]-EMERGENCY DEPT Provider Note   CSN: [id number] Arrival date & time: 05/07/20  1100     History Chief Complaint  Patient presents with  . Rash    Margaret Little is a 35 y.o. female with a past medical history of psoriasis, hypertension, who presents today for evaluation of rash. She was seen 2 days ago for a diffuse rash that originally started on 05/02/2020.  She states that she has had psoriasis in the past however has never had it spread like this before.  She denies any new exposures, changes in lotions, detergents or others.  She states that she was started on steroids 2 days ago however she has been taking those and her symptoms continue to worsen.  She denies any fevers however very does report chills.  She denies any lesions on her mucous membranes including both oral and vaginal.  She states that she was able to get her dermatology appointment moved up to next week.  She states that she took in Epsom salt bath which she feels like helped significantly with the scaling.  She denies any hot tub use, she states that her family at home does not have similar rash.  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  HPI     Past Medical History:  Diagnosis Date  . Gestational diabetes   . Headache    migraines; takes Tylenol  . Hypertension   . Vaginal Pap smear, abnormal    repeat was normal    Patient Active Problem List   Diagnosis Date Noted  . NSVD (9/25) 11/17/2018  . Second degree perineal laceration during delivery 11/17/2018  . Gestational diabetes mellitus (GDM) controlled on oral hypoglycemic drug 11/16/2018  . Gestational hypertension w/o significant proteinuria in 3rd trimester 04/26/2017    Past Surgical History:  Procedure Laterality Date  . WISDOM TOOTH EXTRACTION       OB History    Gravida  2   Para  1   Term  1   Preterm  0   AB  0   Living  1     SAB  0    IAB  0   Ectopic  0   Multiple  0   Live Births  1           Family History  Problem Relation Age of Onset  . Thyroid disease Maternal Grandmother     Social History   Tobacco Use  . Smoking status: Never Smoker  . Smokeless tobacco: Never Used  Vaping Use  . Vaping Use: Never used  Substance Use Topics  . Alcohol use: No  . Drug use: No    Home Medications Prior to Admission medications   Medication Sig Start Date End Date Taking? Authorizing Provider  clindamycin (CLEOCIN) 150 MG capsule Take 3 capsules (450 mg total) by mouth 3 (three) times daily for 7 days. 05/07/20 05/14/20 Yes Cristina Gong, PA-C  acetaminophen (TYLENOL) 500 MG tablet Take 500 mg by mouth every 6 (six) hours as needed for mild pain.    [provider]  hydrocortisone cream 1 % Apply 1 application on the skin twice a day 05/05/20     hydrOXYzine (VISTARIL) 25 MG capsule Take 1 capsule by mouth three times a day 05/05/20     ibuprofen (ADVIL) 600 MG tablet Take 1 tablet (600 mg total) by mouth every 6 (six) hours as  needed. 11/18/18   Gerald Leitz, MD  ketoconazole (NIZORAL) 2 % shampoo Apply 1 application on the skin daily 05/05/20     predniSONE (DELTASONE) 50 MG tablet Take 1 tablet (50 mg total) by mouth daily for 5 days. 05/05/20 05/10/20  Carroll Sage, PA-C  Prenatal MV & Min w/FA-DHA ([employer] PRENATAL GUMMY PO) Take 1 tablet by mouth daily.    [provider]    Allergies    Amoxicillin and Penicillins  Review of Systems   Review of Systems  Constitutional: Positive for chills. Negative for fever.  Skin: Positive for color change and rash.  All other systems reviewed and are negative.   Physical Exam Updated Vital Signs BP (!) 167/88 (BP Location: Right Arm)   Pulse (!) 101   Temp 99.6 F (37.6 C) (Oral)   Resp 18   SpO2 100%   Physical Exam Vitals and nursing note reviewed.  Constitutional:      General: She is not in acute distress.    Appearance:  She is not ill-appearing.  HENT:     Head: Normocephalic.     Comments: No intraoral lesions or obvious mucous membrane involvement. Cardiovascular:     Rate and Rhythm: Normal rate.  Pulmonary:     Effort: Pulmonary effort is normal. No respiratory distress.  Skin:    Comments: Please see clinical image.  There is extensive scaling rash along with diffuse erythematous rash.  Neurological:     Mental Status: She is alert.     Comments: Patient is awake and alert, answers questions appropriately.    Today (05/07/20)                         Pictures from 05/05/20             ED Results / Procedures / Treatments   Labs (all labs ordered are listed, but only abnormal results are displayed) Labs Reviewed - No data to display  EKG None  Radiology No results found.  Procedures Procedures   Medications Ordered in ED Medications - No data to display  ED Course  I have reviewed the triage vital signs and the nursing notes.  Pertinent labs & imaging results that were available during my care of the patient were reviewed by me and considered in my medical decision making (see chart for details).    MDM Rules/Calculators/A&P                         Patient is a 35 year old woman who presents today for evaluation of worsening rash. She was seen 2 days ago and started on redness on however states it continues to worsen.  She fortunately has been able to get her dermatology evaluation moved up to next week. It appears that at this point she still has her underlying psoriasis.  Did consider differential including allergic reaction however she denies no new exposures and has not improved with Benadryl and steroids. She denies any known sick contacts, none of her family members have similar rashes making viral exanthem or scabies less likely.  Doubt SJS or SSSS.  Patient reports chills however is afebrile, on my exam well appearing.   We will treat with  clindamycin given her severe penicillin allergy. Additionally we discussed calling her dermatologist to see if she can be seen sooner, and that if she requires additional emergency medicine evaluation would recommend going to a tertiary center where  dermatology may be available for consult.   Return precautions were discussed with patient who states their understanding.  At the time of discharge patient denied any unaddressed complaints or concerns.  Patient is agreeable for discharge home.  Note: Portions of this report may have been transcribed using voice recognition software. Every effort was made to ensure accuracy; however, inadvertent computerized transcription errors may be present  This patient was discussed with Dr. Particia Nearing.   Final Clinical Impression(s) / ED Diagnoses Final diagnoses:  Rash  Psoriasis    Rx / DC Orders ED Discharge Orders         Ordered    clindamycin (CLEOCIN) 150 MG capsule  3 times daily        05/07/20 1304           Norman Clay 05/07/20 1327    Jacalyn Lefevre, MD 05/08/20 [phone]

## 2020-05-08 ENCOUNTER — Other Ambulatory Visit (HOSPITAL_BASED_OUTPATIENT_CLINIC_OR_DEPARTMENT_OTHER): Payer: Self-pay

## 2020-05-09 ENCOUNTER — Other Ambulatory Visit (HOSPITAL_BASED_OUTPATIENT_CLINIC_OR_DEPARTMENT_OTHER): Payer: Self-pay

## 2020-05-13 ENCOUNTER — Ambulatory Visit: Payer: Medicaid Other | Admitting: Dermatology

## 2020-05-26 ENCOUNTER — Other Ambulatory Visit (HOSPITAL_COMMUNITY): Payer: Self-pay

## 2020-05-26 MED ORDER — TRIAMCINOLONE ACETONIDE 0.1 % EX OINT
1.0000 | TOPICAL_OINTMENT | Freq: Two times a day (BID) | CUTANEOUS | 3 refills | Status: AC
Start: 2020-05-26 — End: ?
  Filled 2020-05-26: qty 454, 34d supply, fill #0
  Filled 2020-06-02: qty 454, 30d supply, fill #0

## 2020-05-27 ENCOUNTER — Other Ambulatory Visit (HOSPITAL_COMMUNITY): Payer: Self-pay

## 2020-05-27 MED ORDER — TRIAMCINOLONE ACETONIDE 0.1 % EX OINT
1.0000 "application " | TOPICAL_OINTMENT | Freq: Two times a day (BID) | CUTANEOUS | 5 refills | Status: AC
  Filled 2020-05-27: qty 454, 34d supply, fill #0
  Filled 2020-05-28: qty 454, 30d supply, fill #0
  Filled 2020-06-02: qty 454, 28d supply, fill #0

## 2020-05-27 MED ORDER — HYDROCORTISONE 2.5 % EX CREA
1.0000 "application " | TOPICAL_CREAM | Freq: Every day | CUTANEOUS | 5 refills | Status: DC
  Filled 2020-05-27: qty 20, 10d supply, fill #0

## 2020-05-28 ENCOUNTER — Other Ambulatory Visit (HOSPITAL_COMMUNITY): Payer: Self-pay

## 2020-05-30 ENCOUNTER — Other Ambulatory Visit (HOSPITAL_COMMUNITY): Payer: Self-pay

## 2020-06-02 ENCOUNTER — Other Ambulatory Visit (HOSPITAL_COMMUNITY): Payer: Self-pay

## 2020-06-18 ENCOUNTER — Other Ambulatory Visit (HOSPITAL_COMMUNITY): Payer: Self-pay

## 2020-06-18 MED ORDER — CYCLOSPORINE MODIFIED (NEORAL) 100 MG PO CAPS
200.0000 mg | ORAL_CAPSULE | Freq: Two times a day (BID) | ORAL | 0 refills | Status: AC
  Filled 2020-06-18 – 2020-06-20 (×2): qty 120, 30d supply, fill #0

## 2020-06-19 ENCOUNTER — Other Ambulatory Visit (HOSPITAL_COMMUNITY): Payer: Self-pay

## 2020-06-20 ENCOUNTER — Other Ambulatory Visit (HOSPITAL_COMMUNITY): Payer: Self-pay

## 2020-06-23 ENCOUNTER — Other Ambulatory Visit (HOSPITAL_COMMUNITY): Payer: Self-pay

## 2020-08-26 ENCOUNTER — Other Ambulatory Visit (HOSPITAL_COMMUNITY): Payer: Self-pay

## 2020-09-21 ENCOUNTER — Encounter (HOSPITAL_COMMUNITY): Payer: Self-pay | Admitting: Emergency Medicine

## 2020-09-21 ENCOUNTER — Emergency Department (HOSPITAL_COMMUNITY): Payer: Medicaid Other

## 2020-09-21 ENCOUNTER — Emergency Department (HOSPITAL_COMMUNITY)
Admission: EM | Admit: 2020-09-21 | Discharge: 2020-09-21 | Disposition: A | Discharge status: Home / Self Care | Payer: Medicaid Other | Attending: Emergency Medicine | Admitting: Emergency Medicine

## 2020-09-21 DIAGNOSIS — S99922A Unspecified injury of left foot, initial encounter: Secondary | ICD-10-CM | POA: Diagnosis present

## 2020-09-21 DIAGNOSIS — W208XXA Other cause of strike by thrown, projected or falling object, initial encounter: Secondary | ICD-10-CM | POA: Insufficient documentation

## 2020-09-21 DIAGNOSIS — Z23 Encounter for immunization: Secondary | ICD-10-CM | POA: Diagnosis not present

## 2020-09-21 DIAGNOSIS — S90112A Contusion of left great toe without damage to nail, initial encounter: Secondary | ICD-10-CM | POA: Diagnosis not present

## 2020-09-21 DIAGNOSIS — I10 Essential (primary) hypertension: Secondary | ICD-10-CM | POA: Diagnosis not present

## 2020-09-21 MED ORDER — TETANUS-DIPHTH-ACELL PERTUSSIS 5-2.5-18.5 LF-MCG/0.5 IM SUSY
0.5000 mL | PREFILLED_SYRINGE | Freq: Once | INTRAMUSCULAR | Status: AC
  Administered 2020-09-21: 0.5 mL via INTRAMUSCULAR
  Filled 2020-09-21: qty 0.5

## 2020-09-21 NOTE — ED Provider Notes (Signed)
[facility]-EMERGENCY DEPT Provider Note   CSN: [id number] Arrival date & time: 09/21/20  1703     History Chief Complaint  Patient presents with   Toe Injury    Margaret Little is a 35 y.o. female.  HPI Patient is a 35 year old female with a medical history as noted below.  She presents to the emergency department due to an injury to the right great toe.  Patient states that she was moving groceries and a can of chicken fell and landed on the distal end of the affected toe.  Reports bleeding from beneath the toenail that is since resolved.  Unsure of the timing of her last Tdap.  Denies any numbness or weakness or other regions of injury.    Past Medical History:  Diagnosis Date   Gestational diabetes    Headache    migraines; takes Tylenol   Hypertension    Vaginal Pap smear, abnormal    repeat was normal    Patient Active Problem List   Diagnosis Date Noted   NSVD (9/25) 11/17/2018   Second degree perineal laceration during delivery 11/17/2018   Gestational diabetes mellitus (GDM) controlled on oral hypoglycemic drug 11/16/2018   Gestational hypertension w/o significant proteinuria in 3rd trimester 04/26/2017    Past Surgical History:  Procedure Laterality Date   WISDOM TOOTH EXTRACTION       OB History     Gravida  2   Para  1   Term  1   Preterm  0   AB  0   Living  1      SAB  0   IAB  0   Ectopic  0   Multiple  0   Live Births  1           Family History  Problem Relation Age of Onset   Thyroid disease Maternal Grandmother     Social History   Tobacco Use   Smoking status: Never   Smokeless tobacco: Never  Vaping Use   Vaping Use: Never used  Substance Use Topics   Alcohol use: No   Drug use: No    Home Medications Prior to Admission medications   Medication Sig Start Date End Date Taking? Authorizing Provider  acetaminophen (TYLENOL) 500 MG tablet Take 500 mg by mouth every 6 (six) hours as  needed for mild pain.    [provider]  clindamycin (CLEOCIN) 150 MG capsule TAKE 3 CAPSULES BY MOUTH 3 TIMES DAILY FOR 7 DAYS 05/07/20 05/07/21  Cristina Gong, PA-C  cycloSPORINE modified (NEORAL) 100 MG capsule Take 2 capsules (200 mg total) by mouth 2 (two) times daily. 05/16/20     hydrocortisone cream 1 % Apply 1 application on the skin twice a day 05/05/20     hydrOXYzine (VISTARIL) 25 MG capsule Take 1 capsule by mouth three times a day 05/05/20     ibuprofen (ADVIL) 600 MG tablet Take 1 tablet (600 mg total) by mouth every 6 (six) hours as needed. 11/18/18   Gerald Leitz, MD  ketoconazole (NIZORAL) 2 % shampoo Apply 1 application on the skin daily 05/05/20     predniSONE (DELTASONE) 50 MG tablet TAKE 1 TABLET (50 MG TOTAL) BY MOUTH DAILY FOR 5 DAYS. 05/05/20 05/05/21  Carroll Sage, PA-C  Prenatal MV & Min w/FA-DHA ([employer] PRENATAL GUMMY PO) Take 1 tablet by mouth daily.    [provider]  triamcinolone ointment (KENALOG) 0.1 % Apply 1 application topically 2 (  two) times daily as needed (for rash). 05/26/20     triamcinolone ointment (KENALOG) 0.1 % Apply 1 application topically 2 (two) times daily to affected areas of the body 05/27/20       Allergies    Amoxicillin and Penicillins  Review of Systems   Review of Systems  Musculoskeletal:  Positive for arthralgias and joint swelling.  Skin:  Positive for color change and wound.  Neurological:  Negative for weakness and numbness.   Physical Exam Updated Vital Signs BP (!) 160/108   Pulse 70   Temp 97.6 F (36.4 C) (Oral)   Resp 16   SpO2 95%   Physical Exam Vitals and nursing note reviewed.  Constitutional:      General: She is not in acute distress.    Appearance: She is well-developed.  HENT:     Head: Normocephalic and atraumatic.     Right Ear: External ear normal.     Left Ear: External ear normal.  Eyes:     General: No scleral icterus.       Right eye: No discharge.        Left eye: No  discharge.     Conjunctiva/sclera: Conjunctivae normal.  Neck:     Trachea: No tracheal deviation.  Cardiovascular:     Rate and Rhythm: Normal rate.  Pulmonary:     Effort: Pulmonary effort is normal. No respiratory distress.     Breath sounds: No stridor.  Abdominal:     General: There is no distension.  Musculoskeletal:        General: Tenderness present. No swelling or deformity.     Cervical back: Neck supple.     Comments: Moderate tenderness noted overlying the distal tip of the right great toe.  Mild swelling in the region.  Developing subungual hematoma noted beneath the distal end of the toenail.  No active bleeding at this time.  Distal sensation intact.  Good cap refill.  2+ pedal pulses.  Skin:    General: Skin is warm and dry.     Findings: No rash.  Neurological:     Mental Status: She is alert.     Cranial Nerves: Cranial nerve deficit: no gross deficits.   ED Results / Procedures / Treatments   Labs (all labs ordered are listed, but only abnormal results are displayed) Labs Reviewed - No data to display  EKG None  Radiology DG Foot Complete Left  Result Date: 09/21/2020 CLINICAL DATA:  Patient reports dropping can of chicken on right great toe causing laceration EXAM: LEFT FOOT - COMPLETE 3+ VIEW COMPARISON:  None. FINDINGS: There is no evidence of fracture or dislocation. There is no evidence of arthropathy or other focal bone abnormality. Soft tissues are unremarkable. IMPRESSION: Negative. Electronically Signed   By: Helyn Numbers MD   On: 09/21/2020 20:16    Procedures Procedures   Medications Ordered in ED Medications  Tdap (BOOSTRIX) injection 0.5 mL (0.5 mLs Intramuscular Given 09/21/20 2037)   ED Course  I have reviewed the triage vital signs and the nursing notes.  Pertinent labs & imaging results that were available during my care of the patient were reviewed by me and considered in my medical decision making (see chart for details).    MDM  Rules/Calculators/A&P                          Pt is a 35 y.o. female who presents to the emergency department  due to pain, swelling, and bleeding from the left great toe after a can of chicken fell on her foot.  Imaging: X-ray of the left foot is negative.  I, Placido Sou, PA-C, personally reviewed and evaluated these images and lab results as part of my medical decision-making.  Patient unsure of the timing of her last Tdap so this was updated in the emergency department.  Patient had a developing subungual hematoma to the distal end of the toenail so I performed trephination in the region.  Patient tolerated the procedure well.  Imaging reassuring.  Feel the patient is stable for discharge at this time and she is agreeable.  Recommended Tylenol and ibuprofen for pain.  Discussed the RICE method.  Discussed return precautions.  Her questions were answered and she was amicable at the time of discharge.  Note: Portions of this report may have been transcribed using voice recognition software. Every effort was made to ensure accuracy; however, inadvertent computerized transcription errors may be present.   Final Clinical Impression(s) / ED Diagnoses Final diagnoses:  Contusion of left great toe without damage to nail, initial encounter   Rx / DC Orders ED Discharge Orders     None        Placido Sou, PA-C 09/22/20 1939    Rolan Bucco, MD 09/25/20 [phone]

## 2020-09-21 NOTE — Discharge Instructions (Signed)
I recommend a combination of tylenol and ibuprofen for management of your pain. You can take a low dose of both at the same time. I recommend 500 mg of Tylenol combined with 600 mg of ibuprofen. This is one maximum strength Tylenol and three regular ibuprofen. You can take these 2-3 times for day for your pain. Please try to take these medications with a small amount of food as well to prevent upsetting your stomach.  Please continue to monitor your symptoms closely.  If they worsen please come back to the emergency department.  It was a pleasure to meet you.

## 2020-09-21 NOTE — ED Triage Notes (Signed)
Patient reports dropping can of chicken on right great toe causing laceration. Bleeding controlled in triage.

## 2020-09-29 ENCOUNTER — Other Ambulatory Visit (HOSPITAL_COMMUNITY): Payer: Self-pay

## 2020-09-29 MED ORDER — CLOBETASOL PROPIONATE 0.05 % EX SOLN
CUTANEOUS | 5 refills | Status: AC
  Filled 2020-09-29: qty 50, 14d supply, fill #0

## 2020-10-01 ENCOUNTER — Ambulatory Visit: Payer: Medicaid Other | Admitting: Dermatology

## 2020-10-07 ENCOUNTER — Other Ambulatory Visit (HOSPITAL_COMMUNITY): Payer: Self-pay

## 2021-05-13 ENCOUNTER — Other Ambulatory Visit (HOSPITAL_COMMUNITY): Payer: Self-pay

## 2021-05-13 MED ORDER — CLOBETASOL PROPIONATE 0.05 % EX OINT
1.0000 "application " | TOPICAL_OINTMENT | Freq: Every day | CUTANEOUS | 3 refills | Status: AC
  Filled 2021-05-13: qty 30, 14d supply, fill #0

## 2021-05-21 ENCOUNTER — Other Ambulatory Visit (HOSPITAL_COMMUNITY): Payer: Self-pay

## 2022-03-12 IMAGING — CR DG FOOT COMPLETE 3+V*L*
3 series · 3 of 3 positions shown · non-contrast
Comparison: None.

CLINICAL DATA: Patient reports dropping can of chicken on right
great toe causing laceration

EXAM:
LEFT FOOT - COMPLETE 3+ VIEW

[x foot ap left]
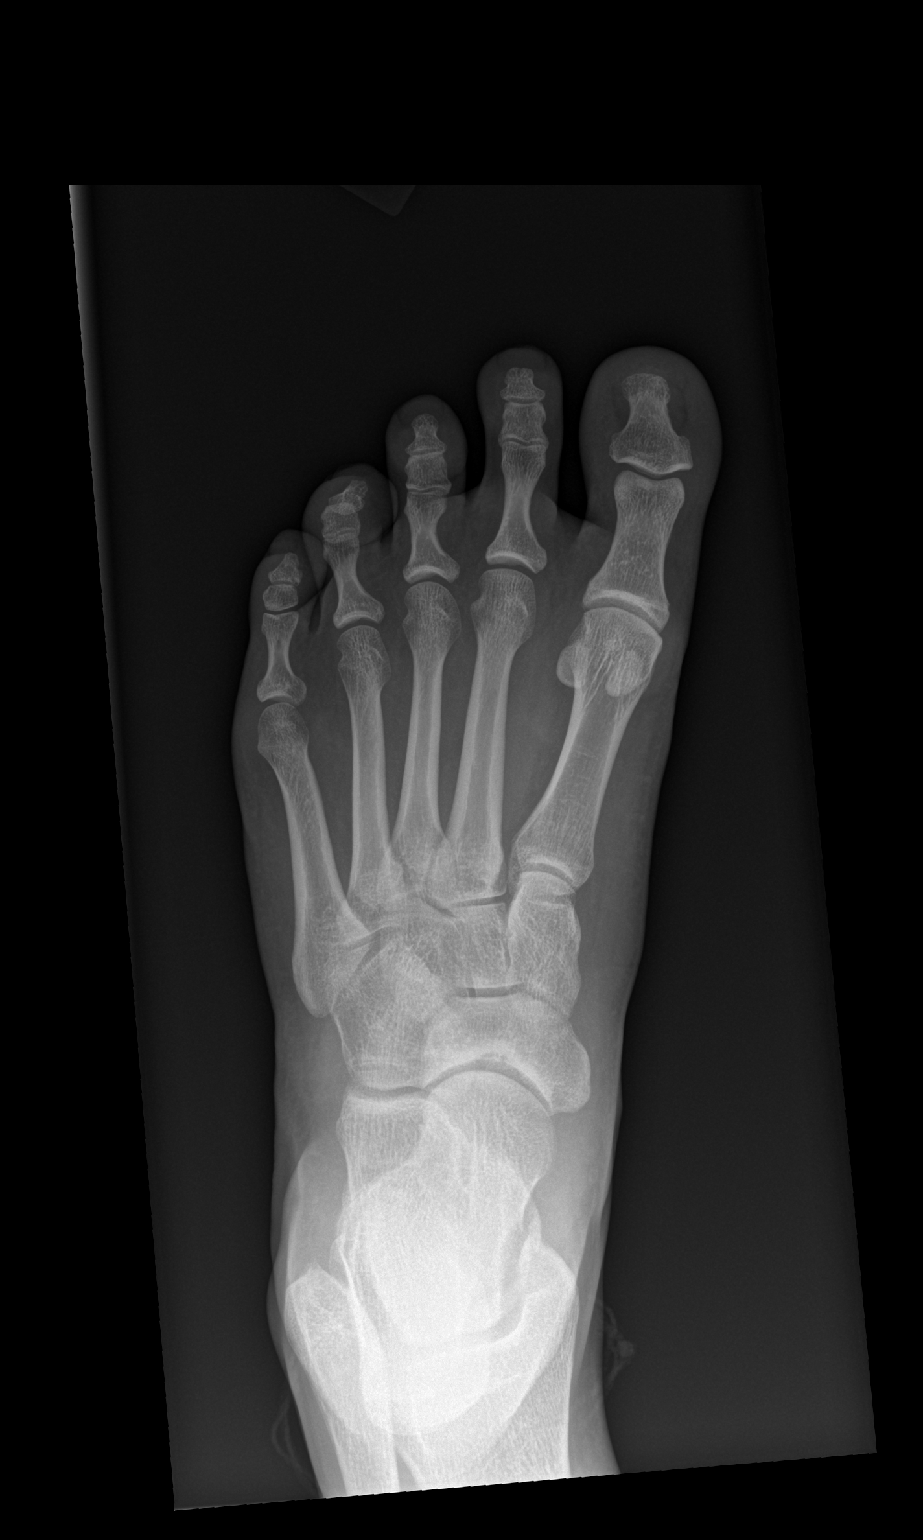

[x foot obl left]
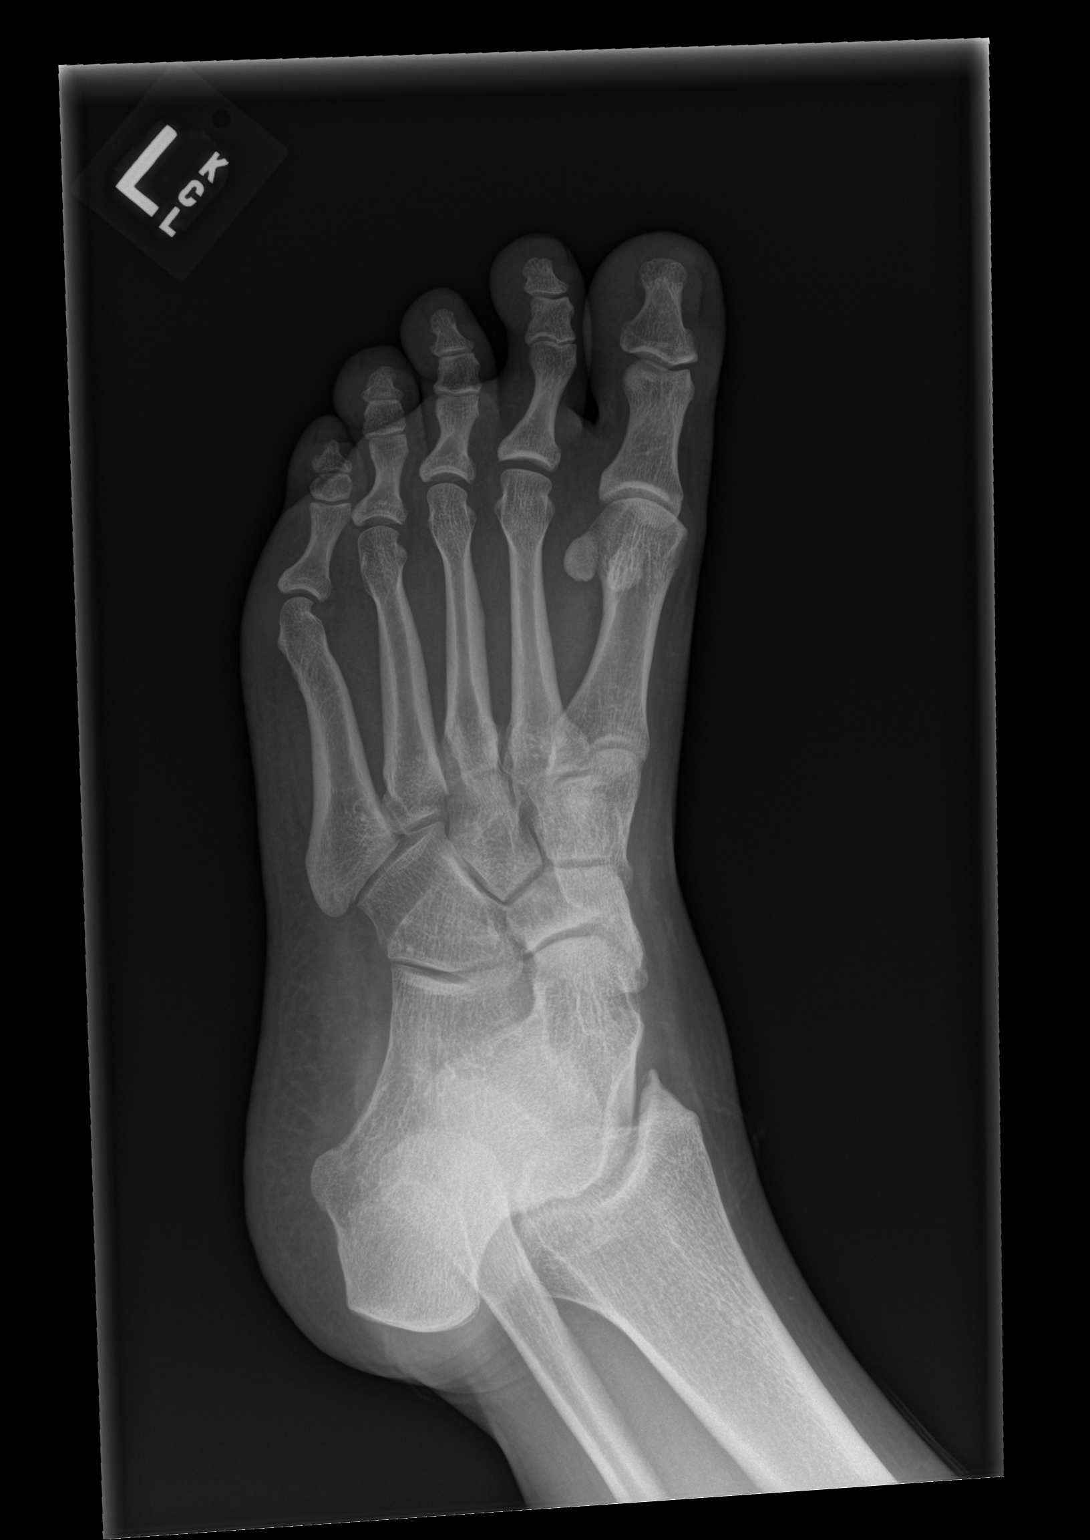

[x foot lat left]
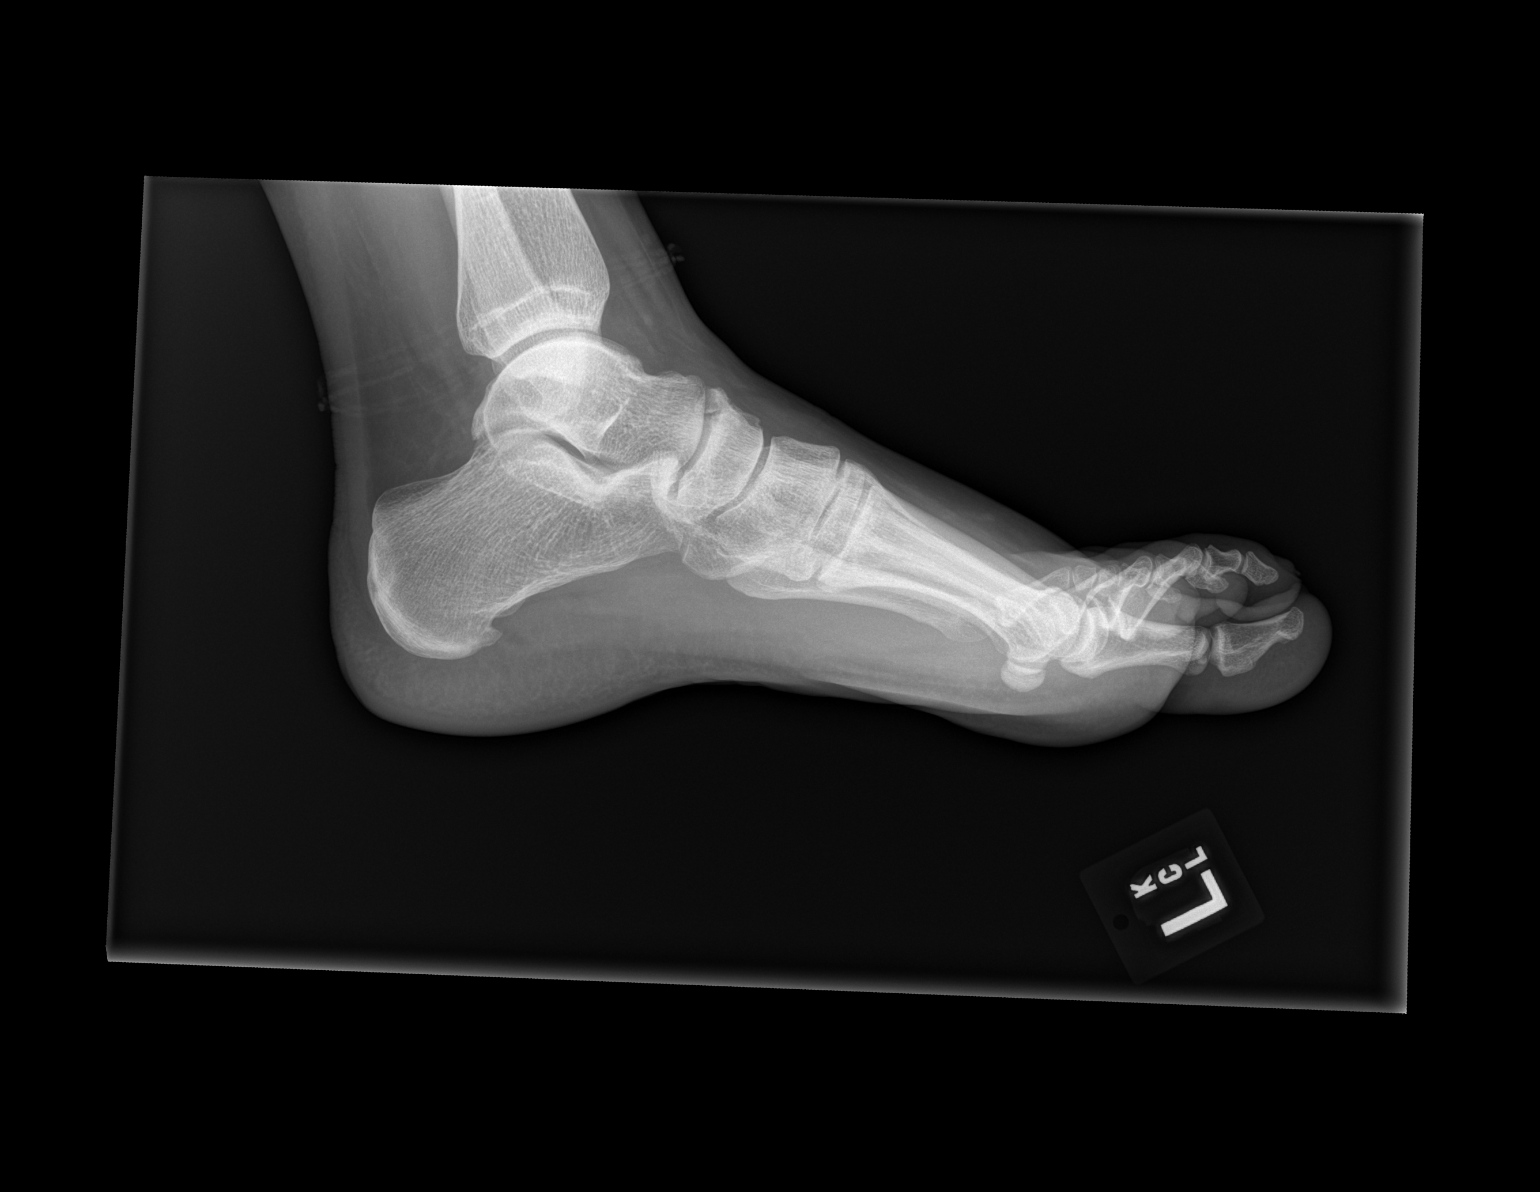

[3 of 3 positions shown; findings below may reference images not displayed]

FINDINGS: There is no evidence of fracture or dislocation. There is no
evidence of arthropathy or other focal bone abnormality. Soft
tissues are unremarkable.
IMPRESSION: Negative.

## 2022-05-11 ENCOUNTER — Emergency Department (HOSPITAL_BASED_OUTPATIENT_CLINIC_OR_DEPARTMENT_OTHER): Payer: Medicaid Other

## 2022-05-11 ENCOUNTER — Emergency Department (HOSPITAL_BASED_OUTPATIENT_CLINIC_OR_DEPARTMENT_OTHER)
Admission: EM | Admit: 2022-05-11 | Discharge: 2022-05-11 | Disposition: A | Discharge status: Home / Self Care | Payer: Medicaid Other | Attending: Emergency Medicine | Admitting: Emergency Medicine

## 2022-05-11 ENCOUNTER — Other Ambulatory Visit: Payer: Self-pay

## 2022-05-11 DIAGNOSIS — S9002XA Contusion of left ankle, initial encounter: Secondary | ICD-10-CM | POA: Diagnosis not present

## 2022-05-11 DIAGNOSIS — W07XXXA Fall from chair, initial encounter: Secondary | ICD-10-CM | POA: Diagnosis not present

## 2022-05-11 DIAGNOSIS — S99912A Unspecified injury of left ankle, initial encounter: Secondary | ICD-10-CM

## 2022-05-11 DIAGNOSIS — I10 Essential (primary) hypertension: Secondary | ICD-10-CM | POA: Diagnosis not present

## 2022-05-11 NOTE — ED Triage Notes (Signed)
Reports pain to L ankle and foot s/p her foot falling through old chair on Saturday. Bruising and swelling noted.

## 2022-05-11 NOTE — Discharge Instructions (Signed)
You were evaluated in the Emergency Department and after careful evaluation, we did not find any emergent condition requiring admission or further testing in the hospital.  Your exam/testing today is overall reassuring.  X-rays without any broken bones.  Continue Tylenol or Motrin, try to rest the leg.  Please return to the Emergency Department if you experience any worsening of your condition.   Thank you for allowing [place] to be a part of your care.

## 2022-05-11 NOTE — ED Provider Notes (Signed)
[facility] Emergency Department Provider Note MRN:  [id number]  Arrival date & time: 05/11/22     Chief Complaint   Leg Pain   History of Present Illness   Margaret Little is a 37 y.o. year-old female with no pertinent past medical history presenting to the ED with chief complaint of leg pain.  3 days ago patient was kneeling on a chair and the chair broke and she fell through the chair and her ankle struck part of the chair hard.  Has had continued pain and worsening bruising to the ankle since that time.  No other injuries.  Review of Systems  A thorough review of systems was obtained and all systems are negative except as noted in the HPI and PMH.   Patient's Health History    Past Medical History:  Diagnosis Date   Gestational diabetes    Headache    migraines; takes Tylenol   Hypertension    Vaginal Pap smear, abnormal    repeat was normal    Past Surgical History:  Procedure Laterality Date   WISDOM TOOTH EXTRACTION      Family History  Problem Relation Age of Onset   Thyroid disease Maternal Grandmother     Social History   Socioeconomic History   Marital status: Married    Spouse name: Not on file   Number of children: Not on file   Years of education: Not on file   Highest education level: Not on file  Occupational History   Not on file  Tobacco Use   Smoking status: Never   Smokeless tobacco: Never  Vaping Use   Vaping Use: Never used  Substance and Sexual Activity   Alcohol use: No   Drug use: No   Sexual activity: Not Currently  Other Topics Concern   Not on file  Social History Narrative   Not on file   Social Determinants of Health   Financial Resource Strain: Low Risk  (11/03/2018)   Overall Financial Resource Strain (CARDIA)    Difficulty of Paying Living Expenses: Not hard at all  Food Insecurity: No Food Insecurity (11/03/2018)   Hunger Vital Sign    Worried About Running Out of Food in the Last Year: Never  true    Ran Out of Food in the Last Year: Never true  Transportation Needs: No Transportation Needs (11/03/2018)   PRAPARE - Hydrologist (Medical): No    Lack of Transportation (Non-Medical): No  Physical Activity: Not on file  Stress: Not on file  Social Connections: Not on file  Intimate Partner Violence: Not on file     Physical Exam   Vitals:   05/11/22 2037  BP: (!) 181/93  Pulse: 84  Resp: 20  Temp: 97.8 F (36.6 C)  SpO2: 100%    CONSTITUTIONAL: Well-appearing, NAD NEURO/PSYCH:  Alert and oriented x 3, no focal deficits EYES:  eyes equal and reactive ENT/NECK:  no LAD, no JVD CARDIO: Regular rate, well-perfused, normal S1 and S2 PULM:  CTAB no wheezing or rhonchi GI/GU:  non-distended, non-tender MSK/SPINE:  No gross deformities, no edema SKIN:  no rash, atraumatic   *Additional and/or pertinent findings included in MDM below  Diagnostic and Interventional Summary    EKG Interpretation  Date/Time:    Ventricular Rate:    PR Interval:    QRS Duration:   QT Interval:    QTC Calculation:   R Axis:     Text Interpretation:  Labs Reviewed - No data to display  DG Ankle Complete Left  Final Result    DG Foot Complete Left  Final Result      Medications - No data to display   Procedures  /  Critical Care Procedures  ED Course and Medical Decision Making  Initial Impression and Ddx Bruising surrounding the left ankle with some spread of the bruising to the heel.  Does not have any tenderness to the foot or heel.  X-rays without signs of fracture.  Limb is neurovascularly intact.  She is bearing weight.  Past medical/surgical history that increases complexity of ED encounter: None  Interpretation of Diagnostics I personally reviewed the ankle x-ray and my interpretation is as follows: No fracture  Radiology commenting on some abnormal appearance to the navicular bone, no tenderness in this area, felt to be  artifact or noncontributory to today's presentation.  Patient Reassessment and Ultimate Disposition/Management     Discharge  Patient management required discussion with the following services or consulting groups:  None  Complexity of Problems Addressed Acute complicated illness or Injury  Additional Data Reviewed and Analyzed Further history obtained from: None  Additional Factors Impacting ED Encounter Risk None  Barth Kirks. Sedonia Small, MD [place] [email or web address]  Final Clinical Impressions(s) / ED Diagnoses     ICD-10-CM   1. Injury of left ankle, initial encounter  [phone]       ED Discharge Orders     None        Discharge Instructions Discussed with and Provided to Patient:    Discharge Instructions      You were evaluated in the Emergency Department and after careful evaluation, we did not find any emergent condition requiring admission or further testing in the hospital.  Your exam/testing today is overall reassuring.  X-rays without any broken bones.  Continue Tylenol or Motrin, try to rest the leg.  Please return to the Emergency Department if you experience any worsening of your condition.   Thank you for allowing [place] to be a part of your care.      Maudie Flakes, MD 05/11/22 [phone]

## 2023-05-03 ENCOUNTER — Other Ambulatory Visit (HOSPITAL_COMMUNITY): Payer: Self-pay

## 2023-05-03 MED ORDER — MELOXICAM 15 MG PO TABS
15.0000 mg | ORAL_TABLET | Freq: Every day | ORAL | 2 refills | Status: AC
  Filled 2023-05-03: qty 30, 30d supply, fill #0

## 2023-08-01 ENCOUNTER — Other Ambulatory Visit: Payer: Self-pay | Admitting: Nurse Practitioner

## 2023-08-01 DIAGNOSIS — N6452 Nipple discharge: Secondary | ICD-10-CM

## 2023-08-04 ENCOUNTER — Encounter

## 2023-08-04 ENCOUNTER — Other Ambulatory Visit

## 2023-08-08 ENCOUNTER — Ambulatory Visit
Admission: RE | Admit: 2023-08-08 | Discharge: 2023-08-08 | Disposition: A | Discharge status: Home / Self Care | Source: Ambulatory Visit | Attending: Nurse Practitioner | Admitting: Nurse Practitioner

## 2023-08-08 DIAGNOSIS — N6452 Nipple discharge: Secondary | ICD-10-CM

## 2023-08-17 ENCOUNTER — Other Ambulatory Visit (HOSPITAL_COMMUNITY): Payer: Self-pay

## 2023-08-17 MED ORDER — METRONIDAZOLE 500 MG PO TABS
500.0000 mg | ORAL_TABLET | Freq: Two times a day (BID) | ORAL | 0 refills | Status: AC
  Filled 2023-08-17: qty 14, 7d supply, fill #0

## 2023-08-25 ENCOUNTER — Other Ambulatory Visit

## 2023-08-25 ENCOUNTER — Encounter

## 2023-08-29 ENCOUNTER — Other Ambulatory Visit (HOSPITAL_COMMUNITY): Payer: Self-pay
